# Patient Record
Sex: Female | Born: 1940
Health system: Southern US, Community
[De-identification: ages and names within clinical notes are randomized; demographics above are authoritative.]

## PROBLEM LIST (undated history)

## (undated) DIAGNOSIS — K922 Gastrointestinal hemorrhage, unspecified: Secondary | ICD-10-CM

## (undated) DIAGNOSIS — I509 Heart failure, unspecified: Secondary | ICD-10-CM

## (undated) DIAGNOSIS — J189 Pneumonia, unspecified organism: Secondary | ICD-10-CM

## (undated) DIAGNOSIS — E785 Hyperlipidemia, unspecified: Secondary | ICD-10-CM

## (undated) DIAGNOSIS — Z9981 Dependence on supplemental oxygen: Secondary | ICD-10-CM

## (undated) DIAGNOSIS — D649 Anemia, unspecified: Secondary | ICD-10-CM

## (undated) DIAGNOSIS — I4901 Ventricular fibrillation: Secondary | ICD-10-CM

## (undated) DIAGNOSIS — J302 Other seasonal allergic rhinitis: Secondary | ICD-10-CM

## (undated) DIAGNOSIS — I639 Cerebral infarction, unspecified: Secondary | ICD-10-CM

## (undated) DIAGNOSIS — I10 Essential (primary) hypertension: Secondary | ICD-10-CM

## (undated) DIAGNOSIS — M1990 Unspecified osteoarthritis, unspecified site: Secondary | ICD-10-CM

## (undated) DIAGNOSIS — J449 Chronic obstructive pulmonary disease, unspecified: Secondary | ICD-10-CM

## (undated) DIAGNOSIS — Z9289 Personal history of other medical treatment: Secondary | ICD-10-CM

## (undated) DIAGNOSIS — I469 Cardiac arrest, cause unspecified: Secondary | ICD-10-CM

## (undated) DIAGNOSIS — K219 Gastro-esophageal reflux disease without esophagitis: Secondary | ICD-10-CM

## (undated) DIAGNOSIS — J42 Unspecified chronic bronchitis: Secondary | ICD-10-CM

## (undated) DIAGNOSIS — C801 Malignant (primary) neoplasm, unspecified: Secondary | ICD-10-CM

## (undated) DIAGNOSIS — I480 Paroxysmal atrial fibrillation: Secondary | ICD-10-CM

## (undated) DIAGNOSIS — N183 Chronic kidney disease, stage 3 unspecified: Secondary | ICD-10-CM

## (undated) DIAGNOSIS — F419 Anxiety disorder, unspecified: Secondary | ICD-10-CM

## (undated) HISTORY — PX: SKIN CANCER EXCISION: SHX779

## (undated) HISTORY — PX: CARDIAC CATHETERIZATION: SHX172

---

## 2013-11-26 DIAGNOSIS — I639 Cerebral infarction, unspecified: Secondary | ICD-10-CM

## 2013-11-26 HISTORY — DX: Cerebral infarction, unspecified: I63.9

## 2016-07-09 DIAGNOSIS — I2510 Atherosclerotic heart disease of native coronary artery without angina pectoris: Secondary | ICD-10-CM | POA: Insufficient documentation

## 2016-07-09 DIAGNOSIS — R072 Precordial pain: Secondary | ICD-10-CM | POA: Insufficient documentation

## 2016-07-09 DIAGNOSIS — I447 Left bundle-branch block, unspecified: Secondary | ICD-10-CM | POA: Insufficient documentation

## 2017-11-26 DIAGNOSIS — I4891 Unspecified atrial fibrillation: Secondary | ICD-10-CM

## 2017-11-26 DIAGNOSIS — D62 Acute posthemorrhagic anemia: Secondary | ICD-10-CM | POA: Diagnosis not present

## 2017-11-26 DIAGNOSIS — S37009A Unspecified injury of unspecified kidney, initial encounter: Secondary | ICD-10-CM

## 2017-11-26 DIAGNOSIS — K922 Gastrointestinal hemorrhage, unspecified: Secondary | ICD-10-CM

## 2017-11-26 DIAGNOSIS — I679 Cerebrovascular disease, unspecified: Secondary | ICD-10-CM

## 2017-11-27 DIAGNOSIS — I509 Heart failure, unspecified: Secondary | ICD-10-CM

## 2017-11-27 DIAGNOSIS — K922 Gastrointestinal hemorrhage, unspecified: Secondary | ICD-10-CM | POA: Diagnosis not present

## 2017-11-27 DIAGNOSIS — I69922 Dysarthria following unspecified cerebrovascular disease: Secondary | ICD-10-CM

## 2017-11-27 DIAGNOSIS — S37009A Unspecified injury of unspecified kidney, initial encounter: Secondary | ICD-10-CM | POA: Diagnosis not present

## 2017-11-27 DIAGNOSIS — I679 Cerebrovascular disease, unspecified: Secondary | ICD-10-CM | POA: Diagnosis not present

## 2017-11-27 DIAGNOSIS — R410 Disorientation, unspecified: Secondary | ICD-10-CM

## 2017-11-27 DIAGNOSIS — D62 Acute posthemorrhagic anemia: Secondary | ICD-10-CM | POA: Diagnosis not present

## 2017-11-28 DIAGNOSIS — I679 Cerebrovascular disease, unspecified: Secondary | ICD-10-CM | POA: Diagnosis not present

## 2017-11-28 DIAGNOSIS — D62 Acute posthemorrhagic anemia: Secondary | ICD-10-CM | POA: Diagnosis not present

## 2017-11-28 DIAGNOSIS — S37009A Unspecified injury of unspecified kidney, initial encounter: Secondary | ICD-10-CM | POA: Diagnosis not present

## 2017-11-28 DIAGNOSIS — R410 Disorientation, unspecified: Secondary | ICD-10-CM | POA: Diagnosis not present

## 2017-11-28 DIAGNOSIS — K922 Gastrointestinal hemorrhage, unspecified: Secondary | ICD-10-CM | POA: Diagnosis not present

## 2017-11-29 DIAGNOSIS — K922 Gastrointestinal hemorrhage, unspecified: Secondary | ICD-10-CM | POA: Diagnosis not present

## 2017-11-29 DIAGNOSIS — D62 Acute posthemorrhagic anemia: Secondary | ICD-10-CM | POA: Diagnosis not present

## 2017-11-29 DIAGNOSIS — I679 Cerebrovascular disease, unspecified: Secondary | ICD-10-CM | POA: Diagnosis not present

## 2017-11-29 DIAGNOSIS — S37009A Unspecified injury of unspecified kidney, initial encounter: Secondary | ICD-10-CM | POA: Diagnosis not present

## 2017-11-30 DIAGNOSIS — I679 Cerebrovascular disease, unspecified: Secondary | ICD-10-CM | POA: Diagnosis not present

## 2017-11-30 DIAGNOSIS — D62 Acute posthemorrhagic anemia: Secondary | ICD-10-CM | POA: Diagnosis not present

## 2017-11-30 DIAGNOSIS — K922 Gastrointestinal hemorrhage, unspecified: Secondary | ICD-10-CM | POA: Diagnosis not present

## 2017-11-30 DIAGNOSIS — S37009A Unspecified injury of unspecified kidney, initial encounter: Secondary | ICD-10-CM | POA: Diagnosis not present

## 2017-12-01 DIAGNOSIS — S37009A Unspecified injury of unspecified kidney, initial encounter: Secondary | ICD-10-CM | POA: Diagnosis not present

## 2017-12-01 DIAGNOSIS — I679 Cerebrovascular disease, unspecified: Secondary | ICD-10-CM | POA: Diagnosis not present

## 2017-12-01 DIAGNOSIS — K922 Gastrointestinal hemorrhage, unspecified: Secondary | ICD-10-CM | POA: Diagnosis not present

## 2017-12-01 DIAGNOSIS — D62 Acute posthemorrhagic anemia: Secondary | ICD-10-CM | POA: Diagnosis not present

## 2017-12-02 DIAGNOSIS — S37009A Unspecified injury of unspecified kidney, initial encounter: Secondary | ICD-10-CM | POA: Diagnosis not present

## 2017-12-02 DIAGNOSIS — I679 Cerebrovascular disease, unspecified: Secondary | ICD-10-CM | POA: Diagnosis not present

## 2017-12-02 DIAGNOSIS — D62 Acute posthemorrhagic anemia: Secondary | ICD-10-CM | POA: Diagnosis not present

## 2017-12-02 DIAGNOSIS — R410 Disorientation, unspecified: Secondary | ICD-10-CM | POA: Diagnosis not present

## 2017-12-02 DIAGNOSIS — K922 Gastrointestinal hemorrhage, unspecified: Secondary | ICD-10-CM | POA: Diagnosis not present

## 2017-12-03 DIAGNOSIS — S37009A Unspecified injury of unspecified kidney, initial encounter: Secondary | ICD-10-CM | POA: Diagnosis not present

## 2017-12-03 DIAGNOSIS — I679 Cerebrovascular disease, unspecified: Secondary | ICD-10-CM | POA: Diagnosis not present

## 2017-12-03 DIAGNOSIS — K922 Gastrointestinal hemorrhage, unspecified: Secondary | ICD-10-CM | POA: Diagnosis not present

## 2017-12-03 DIAGNOSIS — D62 Acute posthemorrhagic anemia: Secondary | ICD-10-CM | POA: Diagnosis not present

## 2017-12-04 DIAGNOSIS — D62 Acute posthemorrhagic anemia: Secondary | ICD-10-CM | POA: Diagnosis not present

## 2017-12-04 DIAGNOSIS — I679 Cerebrovascular disease, unspecified: Secondary | ICD-10-CM | POA: Diagnosis not present

## 2017-12-04 DIAGNOSIS — K922 Gastrointestinal hemorrhage, unspecified: Secondary | ICD-10-CM | POA: Diagnosis not present

## 2017-12-04 DIAGNOSIS — S37009A Unspecified injury of unspecified kidney, initial encounter: Secondary | ICD-10-CM | POA: Diagnosis not present

## 2017-12-05 DIAGNOSIS — K922 Gastrointestinal hemorrhage, unspecified: Secondary | ICD-10-CM | POA: Diagnosis not present

## 2017-12-05 DIAGNOSIS — I679 Cerebrovascular disease, unspecified: Secondary | ICD-10-CM | POA: Diagnosis not present

## 2017-12-05 DIAGNOSIS — S37009A Unspecified injury of unspecified kidney, initial encounter: Secondary | ICD-10-CM | POA: Diagnosis not present

## 2017-12-05 DIAGNOSIS — D62 Acute posthemorrhagic anemia: Secondary | ICD-10-CM | POA: Diagnosis not present

## 2018-06-18 DIAGNOSIS — I739 Peripheral vascular disease, unspecified: Secondary | ICD-10-CM | POA: Insufficient documentation

## 2018-06-18 DIAGNOSIS — G8929 Other chronic pain: Secondary | ICD-10-CM | POA: Insufficient documentation

## 2018-06-18 DIAGNOSIS — S91209A Unspecified open wound of unspecified toe(s) with damage to nail, initial encounter: Secondary | ICD-10-CM | POA: Insufficient documentation

## 2018-06-18 DIAGNOSIS — M79672 Pain in left foot: Secondary | ICD-10-CM

## 2018-11-17 DIAGNOSIS — S37009A Unspecified injury of unspecified kidney, initial encounter: Secondary | ICD-10-CM

## 2018-11-17 DIAGNOSIS — I679 Cerebrovascular disease, unspecified: Secondary | ICD-10-CM

## 2018-11-17 DIAGNOSIS — I509 Heart failure, unspecified: Secondary | ICD-10-CM

## 2018-11-17 DIAGNOSIS — I10 Essential (primary) hypertension: Secondary | ICD-10-CM

## 2018-11-17 DIAGNOSIS — D72829 Elevated white blood cell count, unspecified: Secondary | ICD-10-CM

## 2018-11-17 DIAGNOSIS — A419 Sepsis, unspecified organism: Secondary | ICD-10-CM

## 2018-11-17 DIAGNOSIS — J189 Pneumonia, unspecified organism: Secondary | ICD-10-CM

## 2018-11-17 DIAGNOSIS — J449 Chronic obstructive pulmonary disease, unspecified: Secondary | ICD-10-CM

## 2018-11-18 DIAGNOSIS — A419 Sepsis, unspecified organism: Secondary | ICD-10-CM | POA: Diagnosis not present

## 2018-11-19 DIAGNOSIS — I4901 Ventricular fibrillation: Secondary | ICD-10-CM

## 2018-11-19 DIAGNOSIS — I480 Paroxysmal atrial fibrillation: Secondary | ICD-10-CM | POA: Diagnosis not present

## 2018-11-19 DIAGNOSIS — R7989 Other specified abnormal findings of blood chemistry: Secondary | ICD-10-CM

## 2018-11-19 DIAGNOSIS — E785 Hyperlipidemia, unspecified: Secondary | ICD-10-CM | POA: Diagnosis not present

## 2018-11-19 DIAGNOSIS — I509 Heart failure, unspecified: Secondary | ICD-10-CM | POA: Diagnosis not present

## 2018-11-19 DIAGNOSIS — A419 Sepsis, unspecified organism: Secondary | ICD-10-CM | POA: Diagnosis not present

## 2018-11-19 DIAGNOSIS — I472 Ventricular tachycardia: Secondary | ICD-10-CM | POA: Diagnosis not present

## 2018-11-19 DIAGNOSIS — I469 Cardiac arrest, cause unspecified: Secondary | ICD-10-CM

## 2018-11-19 HISTORY — DX: Ventricular fibrillation: I49.01

## 2018-11-19 HISTORY — DX: Cardiac arrest, cause unspecified: I46.9

## 2018-11-20 ENCOUNTER — Encounter (HOSPITAL_COMMUNITY): Payer: Self-pay | Admitting: Physician Assistant

## 2018-11-20 ENCOUNTER — Other Ambulatory Visit: Payer: Self-pay

## 2018-11-20 ENCOUNTER — Inpatient Hospital Stay (HOSPITAL_COMMUNITY)
Admission: AD | Admit: 2018-11-20 | Discharge: 2018-11-28 | DRG: 286 | Disposition: A | Discharge status: Home Health | Payer: Medicare Other | Source: Other Acute Inpatient Hospital | Attending: Internal Medicine | Admitting: Internal Medicine

## 2018-11-20 ENCOUNTER — Inpatient Hospital Stay (HOSPITAL_COMMUNITY): Payer: Medicare Other

## 2018-11-20 DIAGNOSIS — J9601 Acute respiratory failure with hypoxia: Secondary | ICD-10-CM | POA: Diagnosis present

## 2018-11-20 DIAGNOSIS — I472 Ventricular tachycardia: Secondary | ICD-10-CM | POA: Diagnosis not present

## 2018-11-20 DIAGNOSIS — E440 Moderate protein-calorie malnutrition: Secondary | ICD-10-CM | POA: Diagnosis present

## 2018-11-20 DIAGNOSIS — E873 Alkalosis: Secondary | ICD-10-CM | POA: Diagnosis not present

## 2018-11-20 DIAGNOSIS — I214 Non-ST elevation (NSTEMI) myocardial infarction: Secondary | ICD-10-CM | POA: Diagnosis not present

## 2018-11-20 DIAGNOSIS — I480 Paroxysmal atrial fibrillation: Secondary | ICD-10-CM

## 2018-11-20 DIAGNOSIS — R609 Edema, unspecified: Secondary | ICD-10-CM

## 2018-11-20 DIAGNOSIS — I313 Pericardial effusion (noninflammatory): Secondary | ICD-10-CM | POA: Diagnosis present

## 2018-11-20 DIAGNOSIS — E875 Hyperkalemia: Secondary | ICD-10-CM | POA: Diagnosis not present

## 2018-11-20 DIAGNOSIS — I081 Rheumatic disorders of both mitral and tricuspid valves: Secondary | ICD-10-CM | POA: Diagnosis not present

## 2018-11-20 DIAGNOSIS — R079 Chest pain, unspecified: Secondary | ICD-10-CM

## 2018-11-20 DIAGNOSIS — N183 Chronic kidney disease, stage 3 (moderate): Secondary | ICD-10-CM | POA: Diagnosis present

## 2018-11-20 DIAGNOSIS — F419 Anxiety disorder, unspecified: Secondary | ICD-10-CM | POA: Diagnosis present

## 2018-11-20 DIAGNOSIS — I248 Other forms of acute ischemic heart disease: Secondary | ICD-10-CM | POA: Diagnosis present

## 2018-11-20 DIAGNOSIS — I469 Cardiac arrest, cause unspecified: Secondary | ICD-10-CM

## 2018-11-20 DIAGNOSIS — I272 Pulmonary hypertension, unspecified: Secondary | ICD-10-CM | POA: Diagnosis present

## 2018-11-20 DIAGNOSIS — F0390 Unspecified dementia without behavioral disturbance: Secondary | ICD-10-CM | POA: Diagnosis present

## 2018-11-20 DIAGNOSIS — I10 Essential (primary) hypertension: Secondary | ICD-10-CM | POA: Diagnosis not present

## 2018-11-20 DIAGNOSIS — I340 Nonrheumatic mitral (valve) insufficiency: Secondary | ICD-10-CM | POA: Diagnosis not present

## 2018-11-20 DIAGNOSIS — E782 Mixed hyperlipidemia: Secondary | ICD-10-CM

## 2018-11-20 DIAGNOSIS — K219 Gastro-esophageal reflux disease without esophagitis: Secondary | ICD-10-CM | POA: Diagnosis not present

## 2018-11-20 DIAGNOSIS — Z8673 Personal history of transient ischemic attack (TIA), and cerebral infarction without residual deficits: Secondary | ICD-10-CM

## 2018-11-20 DIAGNOSIS — J449 Chronic obstructive pulmonary disease, unspecified: Secondary | ICD-10-CM

## 2018-11-20 DIAGNOSIS — Z880 Allergy status to penicillin: Secondary | ICD-10-CM

## 2018-11-20 DIAGNOSIS — I361 Nonrheumatic tricuspid (valve) insufficiency: Secondary | ICD-10-CM | POA: Diagnosis not present

## 2018-11-20 DIAGNOSIS — F329 Major depressive disorder, single episode, unspecified: Secondary | ICD-10-CM | POA: Diagnosis not present

## 2018-11-20 DIAGNOSIS — E871 Hypo-osmolality and hyponatremia: Secondary | ICD-10-CM

## 2018-11-20 DIAGNOSIS — A419 Sepsis, unspecified organism: Secondary | ICD-10-CM | POA: Diagnosis not present

## 2018-11-20 DIAGNOSIS — I447 Left bundle-branch block, unspecified: Secondary | ICD-10-CM | POA: Diagnosis present

## 2018-11-20 DIAGNOSIS — N179 Acute kidney failure, unspecified: Secondary | ICD-10-CM | POA: Diagnosis not present

## 2018-11-20 DIAGNOSIS — J440 Chronic obstructive pulmonary disease with acute lower respiratory infection: Secondary | ICD-10-CM | POA: Diagnosis present

## 2018-11-20 DIAGNOSIS — J189 Pneumonia, unspecified organism: Secondary | ICD-10-CM

## 2018-11-20 DIAGNOSIS — Z8249 Family history of ischemic heart disease and other diseases of the circulatory system: Secondary | ICD-10-CM

## 2018-11-20 DIAGNOSIS — R0603 Acute respiratory distress: Secondary | ICD-10-CM | POA: Diagnosis not present

## 2018-11-20 DIAGNOSIS — R05 Cough: Secondary | ICD-10-CM

## 2018-11-20 DIAGNOSIS — E785 Hyperlipidemia, unspecified: Secondary | ICD-10-CM | POA: Diagnosis not present

## 2018-11-20 DIAGNOSIS — J13 Pneumonia due to Streptococcus pneumoniae: Secondary | ICD-10-CM

## 2018-11-20 DIAGNOSIS — I4891 Unspecified atrial fibrillation: Secondary | ICD-10-CM

## 2018-11-20 DIAGNOSIS — R7989 Other specified abnormal findings of blood chemistry: Secondary | ICD-10-CM | POA: Diagnosis not present

## 2018-11-20 DIAGNOSIS — I471 Supraventricular tachycardia: Secondary | ICD-10-CM | POA: Diagnosis not present

## 2018-11-20 DIAGNOSIS — I5033 Acute on chronic diastolic (congestive) heart failure: Secondary | ICD-10-CM | POA: Diagnosis present

## 2018-11-20 DIAGNOSIS — E876 Hypokalemia: Secondary | ICD-10-CM | POA: Diagnosis present

## 2018-11-20 DIAGNOSIS — I4901 Ventricular fibrillation: Secondary | ICD-10-CM | POA: Diagnosis not present

## 2018-11-20 DIAGNOSIS — I2510 Atherosclerotic heart disease of native coronary artery without angina pectoris: Secondary | ICD-10-CM | POA: Diagnosis present

## 2018-11-20 DIAGNOSIS — I130 Hypertensive heart and chronic kidney disease with heart failure and stage 1 through stage 4 chronic kidney disease, or unspecified chronic kidney disease: Secondary | ICD-10-CM | POA: Diagnosis present

## 2018-11-20 DIAGNOSIS — M1990 Unspecified osteoarthritis, unspecified site: Secondary | ICD-10-CM | POA: Diagnosis present

## 2018-11-20 DIAGNOSIS — I4721 Torsades de pointes: Secondary | ICD-10-CM

## 2018-11-20 DIAGNOSIS — R6521 Severe sepsis with septic shock: Secondary | ICD-10-CM | POA: Diagnosis not present

## 2018-11-20 DIAGNOSIS — I5031 Acute diastolic (congestive) heart failure: Secondary | ICD-10-CM | POA: Diagnosis not present

## 2018-11-20 DIAGNOSIS — I4819 Other persistent atrial fibrillation: Secondary | ICD-10-CM | POA: Diagnosis not present

## 2018-11-20 DIAGNOSIS — Z87891 Personal history of nicotine dependence: Secondary | ICD-10-CM

## 2018-11-20 DIAGNOSIS — S37009A Unspecified injury of unspecified kidney, initial encounter: Secondary | ICD-10-CM | POA: Diagnosis not present

## 2018-11-20 DIAGNOSIS — Z833 Family history of diabetes mellitus: Secondary | ICD-10-CM

## 2018-11-20 DIAGNOSIS — R059 Cough, unspecified: Secondary | ICD-10-CM

## 2018-11-20 DIAGNOSIS — E7800 Pure hypercholesterolemia, unspecified: Secondary | ICD-10-CM | POA: Diagnosis not present

## 2018-11-20 DIAGNOSIS — M954 Acquired deformity of chest and rib: Secondary | ICD-10-CM | POA: Diagnosis present

## 2018-11-20 HISTORY — DX: Chronic kidney disease, stage 3 unspecified: N18.30

## 2018-11-20 HISTORY — DX: Anemia, unspecified: D64.9

## 2018-11-20 HISTORY — DX: Ventricular fibrillation: I49.01

## 2018-11-20 HISTORY — DX: Heart failure, unspecified: I50.9

## 2018-11-20 HISTORY — DX: Hyperlipidemia, unspecified: E78.5

## 2018-11-20 HISTORY — DX: Chronic kidney disease, stage 3 (moderate): N18.3

## 2018-11-20 HISTORY — DX: Cerebral infarction, unspecified: I63.9

## 2018-11-20 HISTORY — DX: Paroxysmal atrial fibrillation: I48.0

## 2018-11-20 HISTORY — DX: Gastro-esophageal reflux disease without esophagitis: K21.9

## 2018-11-20 HISTORY — DX: Essential (primary) hypertension: I10

## 2018-11-20 HISTORY — DX: Cardiac arrest, cause unspecified: I46.9

## 2018-11-20 HISTORY — DX: Personal history of other medical treatment: Z92.89

## 2018-11-20 HISTORY — DX: Malignant (primary) neoplasm, unspecified: C80.1

## 2018-11-20 HISTORY — DX: Unspecified osteoarthritis, unspecified site: M19.90

## 2018-11-20 HISTORY — DX: Anxiety disorder, unspecified: F41.9

## 2018-11-20 HISTORY — DX: Dependence on supplemental oxygen: Z99.81

## 2018-11-20 HISTORY — DX: Pneumonia, unspecified organism: J18.9

## 2018-11-20 HISTORY — DX: Gastrointestinal hemorrhage, unspecified: K92.2

## 2018-11-20 HISTORY — DX: Other seasonal allergic rhinitis: J30.2

## 2018-11-20 HISTORY — DX: Chronic obstructive pulmonary disease, unspecified: J44.9

## 2018-11-20 HISTORY — DX: Unspecified chronic bronchitis: J42

## 2018-11-20 LAB — TROPONIN I: Troponin I: 0.29 ng/mL (ref <0.03)

## 2018-11-20 MED ORDER — SODIUM CHLORIDE 0.9 % IV SOLN
250.0000 mL | INTRAVENOUS | Status: DC | PRN
  Administered 2018-11-23: 250 mL via INTRAVENOUS

## 2018-11-20 MED ORDER — SODIUM CHLORIDE 0.9 % IV SOLN
1.0000 g | INTRAVENOUS | Status: DC
  Administered 2018-11-21 – 2018-11-24 (×4): 1 g via INTRAVENOUS
  Filled 2018-11-20 (×4): qty 1

## 2018-11-20 MED ORDER — AMIODARONE HCL IN DEXTROSE 360-4.14 MG/200ML-% IV SOLN
30.0000 mg/h | INTRAVENOUS | Status: DC
  Administered 2018-11-21 – 2018-11-25 (×9): 30 mg/h via INTRAVENOUS
  Filled 2018-11-20 (×10): qty 200

## 2018-11-20 MED ORDER — IBUPROFEN 200 MG PO TABS
400.0000 mg | ORAL_TABLET | Freq: Four times a day (QID) | ORAL | Status: DC | PRN
  Administered 2018-11-20 – 2018-11-27 (×7): 400 mg via ORAL
  Filled 2018-11-20 (×8): qty 2

## 2018-11-20 MED ORDER — SODIUM CHLORIDE 0.9% FLUSH: 3.0000 mL | INTRAVENOUS | Status: DC | PRN

## 2018-11-20 MED ORDER — ENSURE ENLIVE PO LIQD
237.0000 mL | Freq: Two times a day (BID) | ORAL | Status: DC
  Administered 2018-11-21 – 2018-11-28 (×13): 237 mL via ORAL

## 2018-11-20 MED ORDER — HEPARIN (PORCINE) 25000 UT/250ML-% IV SOLN
1100.0000 [IU]/h | INTRAVENOUS | Status: DC
  Administered 2018-11-20: 700 [IU]/h via INTRAVENOUS
  Filled 2018-11-20: qty 250

## 2018-11-20 MED ORDER — ACETAMINOPHEN 650 MG RE SUPP: 650.0000 mg | Freq: Four times a day (QID) | RECTAL | Status: DC | PRN

## 2018-11-20 MED ORDER — ENOXAPARIN SODIUM 40 MG/0.4ML ~~LOC~~ SOLN: 40.0000 mg | SUBCUTANEOUS | Status: DC

## 2018-11-20 MED ORDER — SODIUM CHLORIDE 0.9% FLUSH
3.0000 mL | Freq: Two times a day (BID) | INTRAVENOUS | Status: DC
  Administered 2018-11-21 – 2018-11-24 (×3): 3 mL via INTRAVENOUS

## 2018-11-20 MED ORDER — ACETAMINOPHEN 325 MG PO TABS
650.0000 mg | ORAL_TABLET | Freq: Four times a day (QID) | ORAL | Status: DC | PRN
  Administered 2018-11-21 – 2018-11-27 (×11): 650 mg via ORAL
  Filled 2018-11-20 (×12): qty 2

## 2018-11-20 NOTE — Progress Notes (Addendum)
ANTICOAGULATION & ANTIBIOTIC CONSULT NOTE - Initial Consult  Pharmacy Consult for heparin; Cefepime Indication: atrial fibrillation; HCAP   Allergies not on file  Patient Measurements:   Heparin Dosing Weight: 48kg   Vital Signs:    Labs: No results for input(s): HGB, HCT, PLT, APTT, LABPROT, INR, HEPARINUNFRC, HEPRLOWMOCWT, CREATININE, CKTOTAL, CKMB, TROPONINI in the last 72 hours.  CrCl cannot be calculated (No successful lab value found.).   Medical History: No past medical history on file.  Medications:  No medications prior to admission.    Assessment: 28 yof who is a direct transfer [employer] center. She presented to OSH with Septic shock and Afib and had a code-blue event requiring intubation. Was extubated earlier today and transferred to [facility] for cardiac cath.   AC: Of note she is currently on IV heparin at 700 units after a bolus of 2000 units at ~ 1500 today.   ID: She was also on Cefepime 1 gm IV Q 24 hours for sepsis. Her last dose was at ~1130 this AM.   Of note patient is also on amiodarone at 30 mg/hr which will be continued for now. Will allow cardiology to decide when to transition to oral dosing.   Goal of Therapy:  Heparin level 0.3-0.7 units/ml Monitor platelets by anticoagulation protocol: Yes   Plan:  -Continue IV heparin at 700 units/hr  -F/u 8 hr HL  -Monitor daily HL, CBC, and s/s of bleeding  -Continue cefepime 1 gm IV Q 24 hours and adjust based on renal fx  Albertina Parr, PharmD., BCPS Clinical Pharmacist Clinical phone for 11/20/18 until 10:30pm: [phone] If after 10:30pm, please refer to [facility] for unit-specific pharmacist

## 2018-11-20 NOTE — H&P (Signed)
History and Physical    Joy Dominguez  POE:[id number]  DOB: 08/14/1941  DOA: 11/20/2018 PCP: Patient, No Pcp Per   Transfer from [place] [facility] for cardiac cath.   HPI: Joy Dominguez is a 77 y.o. female with medical history of CVA, dementia, HTN, HLD, A-fib, CHF, COPD, CKD 3, GERD admitted to [facility] on 11/17/18 for acute respiratory failure due to CAP, septic shock requiring pressors. Also had A-fib with RVR which resolved. Noted to have minimally elevated troponin and cardiology suspected demand ischemia.  On 11/19/18 developed Torsades and a code blue was called. She was defibrillated, received compressions and 2 gm of Mg+ with ROSC. (she had a normal Mg+ level that morning). She was intubated during the code and extubated today. Cardiology recommended a cardiac cath for which she was transferred to [facility].    Review of Systems:  Has a cough with yellow sputum but this is getting better Has lower rib pain on and off   Past medical history: VA, dementia, HTN, HLD, A-fib, CHF, COPD, CKD 3, GERD a    Social History:  No longer smokes. Drinks alcohol rarely  Allergies: PCN  Family history: HTN   Prior to Admission medications   Not on File    Physical Exam: Resp Rate   18 17   Resp Rate   Pulse   84 82   Pulse   ECG Heart Rate   88 83   ECG Heart Rate   Rhythm   NSR    Rhythm   BP (cuff)   145...    BP (cuff)   MAP (cuff)   77    MAP (cuff)   GCS   15    GCS        Wt Readings from Last 3 Encounters:  No data found for Wt   Today's Vitals   11/20/18 1655  PainSc: 8    There is no height or weight on file to calculate BMI.Creta Levin    Constitutional:  Calm & comfortable Eyes: PERRLA, lids and conjunctivae normal ENT:  Mucous membranes are moist.  Pharynx clear of exudate   Normal dentition.  Neck: Supple, no masses  Respiratory:  Mild rhonchi- pulse ox 97% on room air Normal respiratory effort.    Cardiovascular:  S1 & S2 heard, regular rate and rhythm No Murmurs Abdomen:  Non distended No tenderness, No masses Bowel sounds normal Extremities:  No clubbing / cyanosis No pedal edema No joint deformity    Skin:  No rashes, lesions or ulcers Neurologic:  AAO x 3 CN 2-12 grossly intact Sensation intact Strength 5/5 in all 4 extremities Psychiatric:  Normal Mood and affect    Labs on Admission: I have personally reviewed following labs and imaging studies  CBC: No results for input(s): WBC, NEUTROABS, HGB, HCT, MCV, PLT in the last 168 hours. Basic Metabolic Panel: No results for input(s): NA, K, CL, CO2, GLUCOSE, BUN, CREATININE, CALCIUM, MG, PHOS in the last 168 hours. GFR: CrCl cannot be calculated (No successful lab value found.). Liver Function Tests: No results for input(s): AST, ALT, ALKPHOS, BILITOT, PROT, ALBUMIN in the last 168 hours. No results for input(s): LIPASE, AMYLASE in the last 168 hours. No results for input(s): AMMONIA in the last 168 hours. Coagulation Profile: No results for input(s): INR, PROTIME in the last 168 hours. Cardiac Enzymes: No results for input(s): CKTOTAL, CKMB, CKMBINDEX, TROPONINI in the last 168 hours. BNP (last 3 results)  No results for input(s): PROBNP in the last 8760 hours. HbA1C: No results for input(s): HGBA1C in the last 72 hours. CBG: No results for input(s): GLUCAP in the last 168 hours. Lipid Profile: No results for input(s): CHOL, HDL, LDLCALC, TRIG, CHOLHDL, LDLDIRECT in the last 72 hours. Thyroid Function Tests: No results for input(s): TSH, T4TOTAL, FREET4, T3FREE, THYROIDAB in the last 72 hours. Anemia Panel: No results for input(s): VITAMINB12, FOLATE, FERRITIN, TIBC, IRON, RETICCTPCT in the last 72 hours. Urine analysis: No results found for: COLORURINE, APPEARANCEUR, LABSPEC, PHURINE, GLUCOSEU, HGBUR, BILIRUBINUR, KETONESUR, PROTEINUR, UROBILINOGEN, NITRITE, LEUKOCYTESUR Sepsis  Labs: @LABRCNTIP (procalcitonin:4,lacticidven:4) )No results found for this or any previous visit (from the past 240 hour(s)).   Radiological Exams on Admission: No results found.   Telemetry monitor: sinus rhythm  Assessment/Plan Principal Problem:   Cardiac arrest with ventricular fibrillation-  Torsades de pointes  - cont Amiodarone infusion - ECHO at [facility] has poor images- showed normal EF and impaired diastolic relaxation - transferred here for Cath-  cardiology to manage  Abnormal movement of chest wall on exam - check rib films to r/o fracture  Pneumonia -Septic shock H/o  COPD  - no COPD exacerbation - WBC count 18.3 on admission improved to 11.2 - on Cefepime since 11/17/18 with a recommended stop date of 11/24/18 by the attending physician - continue    COPD (chronic obstructive pulmonary disease)  - stable     AKI (acute kidney injury) - Cr 1.9 on admission improved to 1.10 - hold Lasix    Atrial fibrillation   - currently NSR - on Heparin infusion    Benign essential HTN - resume Coreg     HLD - cont statin   I am awaiting the med rec to be completed and I will then order home medication.      DVT prophylaxis: Heparin infusion Code Status: Full code  Family Communication:   Disposition Plan: admit to telemetry  Consults called: cardiology     Debbe Odea MD [facility] Pager: [email or web address] Password [email or web address] 7PM-7AM, please contact night-coverage   11/20/2018, 5:47 PM

## 2018-11-20 NOTE — Consult Note (Addendum)
Cardiology Consultation:   Patient ID: Joy Dominguez; [id number]; [birth year]   Admit date: 11/20/2018 Date of Consult: 11/20/2018  Primary Care Provider: Patient, No Pcp Per Primary Cardiologist: No primary care provider on file. Primary Electrophysiologist:  None   Patient Profile:   Joy Dominguez is a 77 y.o. female with a hx of CKD III, COPD, HTN, CVA, HLD, PAF, GERD, who is being seen today for the evaluation of cardiac issues at the request of Dr Wynelle Cleveland.  History of Present Illness:   Ms. Swint was admitted to [facility] on 12/23 for shortness of breath, sepsis, CAP, elevated troponin, acute respiratory failure 2nd COPD, CHF, & PNA; AKI on CKD.  QT noted to be prolonged at 481 ms, avoid QT prolonging drugs. She got Lasix 80 mg IV x 1 for CHF. Initially on BiPAP and was hypothermic, requiring Bair hugger>>temp improved.  Digoxin given for elevated heart rate, heart rate improved.  Per EMS reports, there was no heat or electricity in the house and patient was sitting in her own feces.  She initially required Levophed for pressure support but this was weaned off.  Echo on 12/23 w/ suboptimal views, no LV measurements obtained.  EF 65-70%, impaired relaxation noted.  Left atrium mildly dilated and right atrium mildly enlarged.  Aortic valve not well visualized but mean gradient 8 mm and peak gradient 19 mmHg, MR noted RV pressure 35 mmHg.  Blood gases showed hypoxia and low bicarb.  By 12/26, her blood gas had improved to 7.3 4/39/104/21/20 2/-4.4.  BUN was 20 with a creatinine of 1.5 on admission, by discharge, BUN 17 with creatinine of 1.0.  Potassium was 3.3.  She was improving, but had a cardiac arrest 12/25 2nd VT versus V. fib, possible torsades.  She required CPR, got Mg 2 gm, epi x1, and defib x 1, ROSC.  She was intubated because she was not able to protect her airway, but was weaned off vent this morning and extubated.  Her magnesium level was  checked 12/25 and was 1.9.  She was started on amiodarone drip and heparin drip.  Troponins were checked and were initially elevated.  However, after the arrest, they peaked at 0.69.  She was seen by Dr Bonney Leitz (cardiologist w/ [facility]) 12/25 at [facility], and it was felt she needed transferred to [facility] for cath.  Mrs. Simkin is oriented to name only.  She denies recent chest pain.  She states she had chest pain prior to her work cardiac catheterization which was done sometime during [facility].  She does not remember the results, but does not think they put a stent in.  She said the doctor talked to her husband who has since died.  She says she has had some palpitations, but cannot remember when or describe them further.  She denies chest pain.  She denies lower extremity edema.  She feels she is breathing pretty well.  She does have some chest soreness.  She has no other ongoing complaints.   Past Medical History:  Diagnosis Date  . CHF (congestive heart failure) ([place])   . CKD (chronic kidney disease), stage III ([place])   . COPD (chronic obstructive pulmonary disease) ([place])   . CVA (cerebral vascular accident) ([place]) 2015  . GERD (gastroesophageal reflux disease)   . HTN (hypertension)   . Hyperlipidemia   . OA (osteoarthritis)   . PAF (paroxysmal atrial fibrillation) ([place])     Past Surgical History:  Procedure Laterality Date  .  CARDIAC CATHETERIZATION     > 10 yr ago, no PCI     Prior to Admission medications   See paper chart, D/C summary    Inpatient Medications: Scheduled Meds: . sodium chloride flush  3 mL Intravenous Q12H   Continuous Infusions: . sodium chloride    . [START ON 11/21/2018] amiodarone    . [START ON 11/21/2018] ceFEPime (MAXIPIME) IV    . heparin     PRN Meds: sodium chloride, acetaminophen **OR** acetaminophen, ibuprofen, sodium chloride flush  Allergies:   Allergies not on file  Social History:   Social History   Socioeconomic History    . Marital status: Widowed    Spouse name: Not on file  . Number of children: Not on file  . Years of education: Not on file  . Highest education level: Not on file  Occupational History  . Occupation: Retired  Scientific laboratory technician  . Financial resource strain: Not on file  . Food insecurity:    Worry: Not on file    Inability: Not on file  . Transportation needs:    Medical: Not on file    Non-medical: Not on file  Tobacco Use  . Smoking status: Not on file  Substance and Sexual Activity  . Alcohol use: Not on file  . Drug use: Not on file  . Sexual activity: Not on file  Lifestyle  . Physical activity:    Days per week: Not on file    Minutes per session: Not on file  . Stress: Not on file  Relationships  . Social connections:    Talks on phone: Not on file    Gets together: Not on file    Attends religious service: Not on file    Active member of club or organization: Not on file    Attends meetings of clubs or organizations: Not on file    Relationship status: Not on file  . Intimate partner violence:    Fear of current or ex partner: Not on file    Emotionally abused: Not on file    Physically abused: Not on file    Forced sexual activity: Not on file  Other Topics Concern  . Not on file  Social History Narrative   Pt lives in [place], she says w/ 2 sons, says U[place] Rising helps w/ her medicines.    Family History:   Family History  Problem Relation Age of Onset  . Hypertension Mother   . Heart disease Father   . Diabetes Brother   . Cancer - Colon Brother    Family Status:  Family Status  Relation Name Status  . Mother  Deceased  . Father  Deceased  . Brother  Deceased    ROS:  Please see the history of present illness.  All other ROS reviewed and negative.     Physical Exam/Data:  There were no vitals filed for this visit. No intake or output data in the 24 hours ending 11/20/18 1916 There were no vitals filed for this visit. There is no height or weight  on file to calculate BMI.  General:  Well developed, frail, elderly female, in no acute distress HEENT: normal Lymph: no adenopathy Neck: JVD 9-10 cm Endocrine:  No thryomegaly Vascular: No carotid bruits; 4/4 extremity pulses 2+, without bruits  Cardiac:  normal S1, S2; RRR; no murmur  Lungs: Some rales and rhonchi bilaterally, no wheezing  Abd: soft, nontender, no hepatomegaly  Ext: no edema Musculoskeletal:  No deformities, BUE  and BLE strength normal and equal Skin: warm and dry  Neuro:  CNs 2-12 intact, no focal abnormalities noted Psych:  Normal affect   EKG:  The EKG was personally reviewed and demonstrates: 12/23, sinus rhythm with sinus arrhythmia, left bundle branch block of unknown duration Telemetry:  Telemetry was personally reviewed and demonstrates: Sinus rhythm here  Relevant CV Studies:  ECHO: 12/23 suboptimal views, no LV measurements obtained.  EF 65-70%, impaired relaxation noted.  Left atrium mildly dilated and right atrium mildly enlarged.  Aortic valve not well visualized but mean gradient 8 mm and peak gradient 19 mmHg, MR noted RV pressure 35 mmHg.  Laboratory Data: See paper chart  Radiology/Studies:  No results found.  Assessment and Plan:   1. Cardiac arrest, elevated troponin:  -Unclear cause, magnesium was 1.9 that morning, potassium was not extremely low, no chest pain, troponin elevated, but not extreme. -No wall motion on echocardiogram and EF is normal -Repeat echocardiogram, continue to cycle troponins and see if they are trending up or trending down -Continue heparin for now, history of PAF and patient was supposed to be on Eliquis prior to admission - Discuss situation with her sons, they may prefer conservative management as she had no known ischemic symptoms prior to her cardiac arrest  2.  Diastolic CHF: Patient received 1 dose of IV Lasix on arrival.  No other Lasix given since then -Recheck chest x-ray and BNP - Then decide on whether  or diuresis is needed  3.  PAF and additional arrhythmia: Continue anticoagulation with heparin or Eliquis depending on plans for further procedures. - She will benefit from a beta-blocker, need to make sure her blood pressure will tolerate -She was on Coreg prior to admission, but this was held because of sepsis and hypotension  Otherwise, per IM Principal Problem:   Cardiac arrest with ventricular fibrillation ([place]) Active Problems:   CAP (community acquired pneumonia)   Torsades de pointes ([place])   COPD (chronic obstructive pulmonary disease) ([place])   Septic shock ([place])   Demand ischemia (HCC)   AKI (acute kidney injury) ([place])   Atrial fibrillation ([place])   Benign essential HTN   HLD (hyperlipidemia)   GERD (gastroesophageal reflux disease)   Hyponatremia  For questions or updates, please contact  [facility] Please consult [email or web address] for contact info under Cardiology/STEMI.   Signed, Rosaria Ferries, PA-C  11/20/2018 7:16 PM   The patient was seen, examined and discussed with Rosaria Ferries, PA-C and I agree with the above.    77 y.o. female with a hx of CKD III, COPD, HTN, CVA, HLD, PAF, GERD, who was admitted to [facility] on 12/23 for shortness of breath, sepsis, CAP.  It is not clear in what conditions the patient lives, the patient is a poor historian with underlying dementia and family is currently not present or reachable by phone.  However patient states that her house does not have lighter heating and she was found in her feces in her house before taken to the hospital. She was found to have community-acquired pneumonia and treated with antibiotics.  Levophed was weaned off. On admission she required pressors -Levophed for septic shock, use of BiPAP, she also had mild troponin elevation of 0.28 that was attributed to demand ischemia, echocardiogram at that time showed hyperdynamic LVEF with impaired relaxation and no significant valvular abnormality. Yesterday,  December 25 she developed torsades requiring defibrillation after which her troponin increased to 0.53.  She was started on heparin drip and was  also hydrated as she was originally found with acute kidney failure secondary to dehydration.  Her hemoglobin of 13 on admission dropped to 9.4.  She was started on amiodarone drip and magnesium of 1.9 was repleted.  The patient denies any chest pain prior to admission during the hospitalization or now also did not have any shortness of breath prior to admission.  She states that she can walk with assistance.  Assessment  Community-acquired pneumonia complicated by acute septic shock requiring short-term of Levophed Troponin elevation in the settings of septic shock and torsades requiring ICD shock Torsades the pointes Advanced dementia History of CVA Left bundle branch block, no prior EKG available for comparison Acute kidney failure in the setting of dehydration Paroxysmal atrial fibrillation  Plan: Obtain labs, specifically CBC CMP and troponin x2 Discontinue heparin if troponins are down trending Repeat an echocardiogram as prior was performed prior to torsades and ICD shocks Considering the patient has advanced dementia, lack of symptoms and prior history of stroke, unless significant change in LVEF or new regional wall motion abnormalities I would strongly recommend to avoid invasive cardiac catheterization and risk stratify the patient with a Lexiscan nuclear stress test. Continue amiodarone drip for now Replace potassium, magnesium as needed based on labs results  Ena Dawley, MD 11/20/2018

## 2018-11-21 ENCOUNTER — Encounter (HOSPITAL_COMMUNITY): Payer: Self-pay | Admitting: General Practice

## 2018-11-21 ENCOUNTER — Inpatient Hospital Stay (HOSPITAL_COMMUNITY): Payer: Medicare Other

## 2018-11-21 ENCOUNTER — Inpatient Hospital Stay: Payer: Self-pay

## 2018-11-21 ENCOUNTER — Ambulatory Visit (HOSPITAL_COMMUNITY): Payer: Medicare Other

## 2018-11-21 DIAGNOSIS — E440 Moderate protein-calorie malnutrition: Secondary | ICD-10-CM

## 2018-11-21 DIAGNOSIS — I361 Nonrheumatic tricuspid (valve) insufficiency: Secondary | ICD-10-CM

## 2018-11-21 DIAGNOSIS — I214 Non-ST elevation (NSTEMI) myocardial infarction: Secondary | ICD-10-CM

## 2018-11-21 DIAGNOSIS — J189 Pneumonia, unspecified organism: Secondary | ICD-10-CM

## 2018-11-21 DIAGNOSIS — I340 Nonrheumatic mitral (valve) insufficiency: Secondary | ICD-10-CM

## 2018-11-21 LAB — CBC
HEMATOCRIT: 31.6 % — AB (ref 36.0–46.0)
Hemoglobin: 10 g/dL — ABNORMAL LOW (ref 12.0–15.0)
MCH: 28.9 pg (ref 26.0–34.0)
MCHC: 31.6 g/dL (ref 30.0–36.0)
MCV: 91.3 fL (ref 80.0–100.0)
Platelets: 284 10*3/uL (ref 150–400)
RBC: 3.46 MIL/uL — ABNORMAL LOW (ref 3.87–5.11)
RDW: 12.4 % (ref 11.5–15.5)
WBC: 13.7 10*3/uL — ABNORMAL HIGH (ref 4.0–10.5)
nRBC: 0 % (ref 0.0–0.2)

## 2018-11-21 LAB — HEPARIN LEVEL (UNFRACTIONATED)
Heparin Unfractionated: 0.13 IU/mL — ABNORMAL LOW (ref 0.30–0.70)
Heparin Unfractionated: 0.33 IU/mL (ref 0.30–0.70)
Heparin Unfractionated: <0.1 IU/mL — ABNORMAL LOW (ref 0.30–0.70)

## 2018-11-21 LAB — BASIC METABOLIC PANEL
Anion gap: 11 (ref 5–15)
BUN: 13 mg/dL (ref 8–23)
CHLORIDE: 109 mmol/L (ref 98–111)
CO2: 17 mmol/L — ABNORMAL LOW (ref 22–32)
Calcium: 8.7 mg/dL — ABNORMAL LOW (ref 8.9–10.3)
Creatinine, Ser: 1.03 mg/dL — ABNORMAL HIGH (ref 0.44–1.00)
GFR calc Af Amer: 60 mL/min — ABNORMAL LOW (ref >60)
GFR calc non Af Amer: 52 mL/min — ABNORMAL LOW (ref >60)
GLUCOSE: 91 mg/dL (ref 70–99)
Potassium: 4.5 mmol/L (ref 3.5–5.1)
Sodium: 137 mmol/L (ref 135–145)

## 2018-11-21 LAB — TROPONIN I
Troponin I: 0.26 ng/mL (ref <0.03)
Troponin I: 0.16 ng/mL (ref <0.03)

## 2018-11-21 LAB — MAGNESIUM: Magnesium: 1.9 mg/dL (ref 1.7–2.4)

## 2018-11-21 LAB — ECHOCARDIOGRAM COMPLETE
Height: 62 in
WEIGHTICAEL: 1696 [oz_av]

## 2018-11-21 MED ORDER — CITALOPRAM HYDROBROMIDE 20 MG PO TABS: 20.0000 mg | ORAL_TABLET | Freq: Every day | ORAL | Status: DC

## 2018-11-21 MED ORDER — CARVEDILOL 3.125 MG PO TABS
3.1250 mg | ORAL_TABLET | Freq: Two times a day (BID) | ORAL | Status: DC
  Administered 2018-11-21: 3.125 mg via ORAL
  Filled 2018-11-21: qty 1

## 2018-11-21 MED ORDER — ALBUTEROL SULFATE (2.5 MG/3ML) 0.083% IN NEBU: 2.5000 mg | INHALATION_SOLUTION | Freq: Four times a day (QID) | RESPIRATORY_TRACT | Status: DC | PRN

## 2018-11-21 MED ORDER — FUROSEMIDE 10 MG/ML IJ SOLN
40.0000 mg | Freq: Two times a day (BID) | INTRAMUSCULAR | Status: DC
  Administered 2018-11-21: 40 mg via INTRAVENOUS
  Filled 2018-11-21: qty 4

## 2018-11-21 MED ORDER — MAGNESIUM SULFATE 2 GM/50ML IV SOLN
2.0000 g | Freq: Once | INTRAVENOUS | Status: AC
  Administered 2018-11-21: 2 g via INTRAVENOUS
  Filled 2018-11-21: qty 50

## 2018-11-21 MED ORDER — ATORVASTATIN CALCIUM 10 MG PO TABS
10.0000 mg | ORAL_TABLET | Freq: Every day | ORAL | Status: DC
  Administered 2018-11-21 – 2018-11-22 (×2): 10 mg via ORAL
  Filled 2018-11-21 (×2): qty 1

## 2018-11-21 MED ORDER — AMLODIPINE BESYLATE 5 MG PO TABS
5.0000 mg | ORAL_TABLET | Freq: Every day | ORAL | Status: DC
  Administered 2018-11-21: 5 mg via ORAL
  Filled 2018-11-21: qty 1

## 2018-11-21 MED ORDER — FUROSEMIDE 10 MG/ML IJ SOLN
40.0000 mg | Freq: Three times a day (TID) | INTRAMUSCULAR | Status: DC
  Administered 2018-11-21 – 2018-11-22 (×2): 40 mg via INTRAVENOUS
  Filled 2018-11-21 (×2): qty 4

## 2018-11-21 MED ORDER — SODIUM CHLORIDE 0.9% FLUSH
10.0000 mL | Freq: Two times a day (BID) | INTRAVENOUS | Status: DC
  Administered 2018-11-21 – 2018-11-25 (×7): 10 mL
  Administered 2018-11-26: 20 mL
  Administered 2018-11-26: 10 mL
  Administered 2018-11-27: 20 mL
  Administered 2018-11-27: 10 mL

## 2018-11-21 MED ORDER — AMLODIPINE BESYLATE 10 MG PO TABS
10.0000 mg | ORAL_TABLET | Freq: Every day | ORAL | Status: DC
  Administered 2018-11-22 – 2018-11-28 (×7): 10 mg via ORAL
  Filled 2018-11-21 (×7): qty 1

## 2018-11-21 MED ORDER — ADULT MULTIVITAMIN W/MINERALS CH
1.0000 | ORAL_TABLET | Freq: Every day | ORAL | Status: DC
  Administered 2018-11-21 – 2018-11-28 (×8): 1 via ORAL
  Filled 2018-11-21 (×8): qty 1

## 2018-11-21 MED ORDER — LAMOTRIGINE 25 MG PO TABS
25.0000 mg | ORAL_TABLET | Freq: Three times a day (TID) | ORAL | Status: DC
  Administered 2018-11-21 – 2018-11-28 (×21): 25 mg via ORAL
  Filled 2018-11-21 (×24): qty 1

## 2018-11-21 MED ORDER — SODIUM CHLORIDE 0.9% FLUSH
10.0000 mL | INTRAVENOUS | Status: DC | PRN
  Administered 2018-11-25: 10 mL
  Filled 2018-11-21: qty 40

## 2018-11-21 MED ORDER — GUAIFENESIN ER 600 MG PO TB12
600.0000 mg | ORAL_TABLET | Freq: Two times a day (BID) | ORAL | Status: DC
  Administered 2018-11-21 – 2018-11-28 (×15): 600 mg via ORAL
  Filled 2018-11-21 (×16): qty 1

## 2018-11-21 MED ORDER — GABAPENTIN 100 MG PO CAPS
100.0000 mg | ORAL_CAPSULE | Freq: Every day | ORAL | Status: DC
  Administered 2018-11-21 – 2018-11-27 (×7): 100 mg via ORAL
  Filled 2018-11-21 (×7): qty 1

## 2018-11-21 MED ORDER — HEPARIN BOLUS VIA INFUSION
1000.0000 [IU] | Freq: Once | INTRAVENOUS | Status: AC
  Administered 2018-11-21: 1000 [IU] via INTRAVENOUS
  Filled 2018-11-21: qty 1000

## 2018-11-21 MED ORDER — HYDRALAZINE HCL 25 MG PO TABS
25.0000 mg | ORAL_TABLET | Freq: Four times a day (QID) | ORAL | Status: DC
  Administered 2018-11-21 – 2018-11-22 (×4): 25 mg via ORAL
  Filled 2018-11-21 (×4): qty 1

## 2018-11-21 MED ORDER — LOSARTAN POTASSIUM 25 MG PO TABS
25.0000 mg | ORAL_TABLET | Freq: Every day | ORAL | Status: DC
  Administered 2018-11-22: 25 mg via ORAL
  Filled 2018-11-21: qty 1

## 2018-11-21 NOTE — Progress Notes (Signed)
*  PRELIMINARY RESULTS* Echocardiogram 2D Echocardiogram has been performed.  Samuel Germany 11/21/2018, 3:19 PM

## 2018-11-21 NOTE — Progress Notes (Addendum)
PROGRESS NOTE    Joy Dominguez   ZOX:[id number]  DOB: August 26, 1941  DOA: 11/20/2018 PCP: Welford Roche, NP   Brief Narrative:  Joy Dominguez  is a 77 y.o. female with medical history of CVA, dementia, HTN, HLD, A-fib, CHF, COPD, CKD 3, GERD admitted to [facility] center on 11/17/18 for acute respiratory failure due to CAP, septic shock requiring pressors. Also had A-fib with RVR which resolved. Noted to have minimally elevated troponin and cardiology suspected demand ischemia.  On 11/19/18 developed Torsades and a code blue was called. She was defibrillated, received compressions and 2 gm of Mg+ with ROSC. (she had a normal Mg+ and K+ level that morning). She was intubated during the code and extubated today. Cardiology recommended a cardiac cath for which she was transferred to [facility].    Subjective: Has a cough today.    Assessment & Plan:   Principal Problem:   Cardiac arrest with ventricular fibrillation-  Torsades de pointes  minimally elevated troponin - cont Amiodarone infusion - ECHO at [facility] has poor images- showed normal EF and impaired diastolic relaxation (done prior to cardiac arrest)- repeating ECHO - transferred here for Cath-  cardiology to manage-   Acute CHF, unspecifiec - crackles on exam today, CXR suggestive of pulm edem- have started Lasix IV - repeat ECHO pending- d/w Dr Meda Coffee    Benign essential HTN - resume Norvasc and increase dose as BP is quite high- add Hydralazine today as well and diurese  Pneumonia -Septic shock H/o  COPD  - has some wheezing and congestion today- ?COPD exacerbation - cont Albuterol Nebs - WBC count 18.3 on admission (in [place]) improved to 11.2 - on Cefepime since 11/17/18 with a recommended stop date of 11/24/18 by the attending physician - continue Albuterol nebs  Abnormal movement of chest wall on exam - checked rib films which show deformities due to prior fractures     AKI (acute  kidney injury) - Cr 1.9 on admission to [facility]- improved to ~ 1.0    Atrial fibrillation  / SVT  - cont Coreg, Amiodarone - on Heparin infusion    HLD - cont statin    DVT prophylaxis: Heparin infusion Code Status: Full code Family Communication:  Disposition Plan: follow on telemetry Consultants:   cardiology Procedures:   ECHO  CPR/ Intubation Antimicrobials:  Anti-infectives (From admission, onward)   Start     Dose/Rate Route Frequency Ordered Stop   11/21/18 1100  ceFEPIme (MAXIPIME) 1 g in sodium chloride 0.9 % 100 mL IVPB     1 g 200 mL/hr over 30 Minutes Intravenous Every 24 hours 11/20/18 1838         Objective: Vitals:   11/21/18 0818 11/21/18 0826 11/21/18 1100 11/21/18 1119  BP: (!) 165/81   (!) 177/80  Pulse:  83    Resp: (!) 28 (!) 26    Temp:      TempSrc:      SpO2:  100%    Weight:   48.1 kg   Height:   5\' 2"  (1.575 m)     Intake/Output Summary (Last 24 hours) at 11/21/2018 1454 Last data filed at 11/21/2018 0844 Gross per 24 hour  Intake 135.33 ml  Output -  Net 135.33 ml   Filed Weights   11/21/18 1100  Weight: 48.1 kg    Examination: General exam: Appears comfortable  HEENT: PERRLA, oral mucosa moist, no sclera icterus or thrush Respiratory system: b/l crackles-  Cardiovascular system: S1 & S2 heard, RRR.   Gastrointestinal system: Abdomen soft, non-tender, nondistended. Normal bowel sounds. Central nervous system: Alert and oriented. No focal neurological deficits. Extremities: No cyanosis, clubbing or edema Skin: No rashes or ulcers Psychiatry:  Mood & affect appropriate.     Data Reviewed: I have personally reviewed following labs and imaging studies  CBC: Recent Labs  Lab 11/21/18 0036  WBC 13.7*  HGB 10.0*  HCT 31.6*  MCV 91.3  PLT 710   Basic Metabolic Panel: Recent Labs  Lab 11/21/18 0036  NA 137  K 4.5  CL 109  CO2 17*  GLUCOSE 91  BUN 13  CREATININE 1.03*  CALCIUM 8.7*  MG 1.9    GFR: Estimated Creatinine Clearance: 33.6 mL/min (A) (by C-G formula based on SCr of 1.03 mg/dL (H)). Liver Function Tests: No results for input(s): AST, ALT, ALKPHOS, BILITOT, PROT, ALBUMIN in the last 168 hours. No results for input(s): LIPASE, AMYLASE in the last 168 hours. No results for input(s): AMMONIA in the last 168 hours. Coagulation Profile: No results for input(s): INR, PROTIME in the last 168 hours. Cardiac Enzymes: Recent Labs  Lab 11/20/18 2024 11/21/18 0036 11/21/18 0659  TROPONINI 0.29* 0.26* 0.16*   BNP (last 3 results) No results for input(s): PROBNP in the last 8760 hours. HbA1C: No results for input(s): HGBA1C in the last 72 hours. CBG: No results for input(s): GLUCAP in the last 168 hours. Lipid Profile: No results for input(s): CHOL, HDL, LDLCALC, TRIG, CHOLHDL, LDLDIRECT in the last 72 hours. Thyroid Function Tests: No results for input(s): TSH, T4TOTAL, FREET4, T3FREE, THYROIDAB in the last 72 hours. Anemia Panel: No results for input(s): VITAMINB12, FOLATE, FERRITIN, TIBC, IRON, RETICCTPCT in the last 72 hours. Urine analysis: No results found for: COLORURINE, APPEARANCEUR, LABSPEC, PHURINE, GLUCOSEU, HGBUR, BILIRUBINUR, KETONESUR, PROTEINUR, UROBILINOGEN, NITRITE, LEUKOCYTESUR Sepsis Labs: @LABRCNTIP (procalcitonin:4,lacticidven:4) )No results found for this or any previous visit (from the past 240 hour(s)).       Radiology Studies: Dg Chest 2 View  Result Date: 11/20/2018 CLINICAL DATA:  Status post CPR EXAM: CHEST - 2 VIEW COMPARISON:  None. FINDINGS: Cardiac shadow is at the upper limits of normal in size. Aortic calcifications are seen. Mild central vascular congestion is noted as well as prominent pleural effusions left greater than right. No definitive acute bony abnormality is seen. Previously seen rib deformities are again noted and likely chronic. IMPRESSION: Vascular congestion and bilateral pleural effusions left greater than right.  Electronically Signed   By: Inez Catalina M.D.   On: 11/20/2018 20:45   Dg Ribs Bilateral  Result Date: 11/20/2018 CLINICAL DATA:  Status post CPR, chest pain EXAM: BILATERAL RIBS - 3+ VIEW COMPARISON:  None. FINDINGS: Cardiac shadow is enlarged. Rib deformities are noted bilaterally involving the third sixth seventh and eighth ribs on the left and the fifth rib on the right. These are likely related to prior fractures. No acute displaced fracture is noted. No pneumothorax is seen. Bilateral pleural effusions left greater than right are noted with central vascular congestion. IMPRESSION: Multiple rib deformities likely chronic in nature. Vascular congestion with bilateral effusions left greater than right. No pneumothorax is seen. Electronically Signed   By: Inez Catalina M.D.   On: 11/20/2018 20:37      Scheduled Meds: . amLODipine  5 mg Oral Daily  . atorvastatin  10 mg Oral Daily  . carvedilol  3.125 mg Oral BID WC  . feeding supplement (ENSURE ENLIVE)  237 mL Oral BID  BM  . furosemide  40 mg Intravenous BID  . gabapentin  100 mg Oral QHS  . guaiFENesin  600 mg Oral BID  . lamoTRIgine  25 mg Oral TID  . multivitamin with minerals  1 tablet Oral Daily  . sodium chloride flush  3 mL Intravenous Q12H   Continuous Infusions: . sodium chloride    . amiodarone 30 mg/hr (11/21/18 1404)  . ceFEPime (MAXIPIME) IV 1 g (11/21/18 1140)  . heparin 900 Units/hr (11/21/18 0227)  . magnesium sulfate 1 - 4 g bolus IVPB       LOS: 1 day    Time spent in minutes: [place], MD [facility] Pager: [email or web address] Password [facility] 11/21/2018, 2:54 PM

## 2018-11-21 NOTE — Progress Notes (Addendum)
Progress Note  Patient Name: Jorge Amparo Date of Encounter: 11/21/2018  Primary Cardiologist: Ena Dawley, MD   Subjective   Out of bed and in a chair. Currently CP free but she reports recent CP, left sided and sharp. Currently on [place]. Also with productive cough and wheezing.   Inpatient Medications    Scheduled Meds: . [START ON 11/22/2018] amLODipine  10 mg Oral Daily  . atorvastatin  10 mg Oral Daily  . feeding supplement (ENSURE ENLIVE)  237 mL Oral BID BM  . furosemide  40 mg Intravenous TID  . gabapentin  100 mg Oral QHS  . guaiFENesin  600 mg Oral BID  . hydrALAZINE  25 mg Oral Q6H  . lamoTRIgine  25 mg Oral TID  . multivitamin with minerals  1 tablet Oral Daily  . sodium chloride flush  10-40 mL Intracatheter Q12H  . sodium chloride flush  3 mL Intravenous Q12H   Continuous Infusions: . sodium chloride    . amiodarone 30 mg/hr (11/21/18 1948)  . ceFEPime (MAXIPIME) IV 1 g (11/21/18 1140)  . heparin 900 Units/hr (11/21/18 1748)   PRN Meds: sodium chloride, acetaminophen **OR** acetaminophen, albuterol, ibuprofen, sodium chloride flush, sodium chloride flush   Vital Signs    Vitals:   11/21/18 1100 11/21/18 1119 11/21/18 1811 11/21/18 2001  BP:  (!) 177/80 (!) 149/71 (!) 146/57  Pulse:    81  Resp:    18  Temp:    97.9 F (36.6 C)  TempSrc:    Oral  SpO2:    100%  Weight: 48.1 kg     Height: 5\' 2"  (1.575 m)       Intake/Output Summary (Last 24 hours) at 11/21/2018 2251 Last data filed at 11/21/2018 1948 Gross per 24 hour  Intake 642.85 ml  Output 1200 ml  Net -557.15 ml   Filed Weights   11/21/18 1100  Weight: 48.1 kg    Telemetry    NSR, brief run of SVT earlier this morning - Personally Reviewed  ECG    NSR, LBBB- Personally Reviewed  Physical Exam   GEN: Elderly frail appearing WF in No acute distress but on [place]. Neck: No JVD Cardiac: RRR, no murmurs, rubs, or gallops.  Respiratory: diffuse bilateral expiratory  wheezing and rhonchi, crackles at the bases GI: Soft, nontender, non-distended  MS: No edema; No deformity. Neuro:  Nonfocal  Psych: Normal affect   Labs    Chemistry Recent Labs  Lab 11/21/18 0036  NA 137  K 4.5  CL 109  CO2 17*  GLUCOSE 91  BUN 13  CREATININE 1.03*  CALCIUM 8.7*  GFRNONAA 52*  GFRAA 60*  ANIONGAP 11     Hematology Recent Labs  Lab 11/21/18 0036  WBC 13.7*  RBC 3.46*  HGB 10.0*  HCT 31.6*  MCV 91.3  MCH 28.9  MCHC 31.6  RDW 12.4  PLT 284    Cardiac Enzymes Recent Labs  Lab 11/20/18 2024 11/21/18 0036 11/21/18 0659  TROPONINI 0.29* 0.26* 0.16*   No results for input(s): TROPIPOC in the last 168 hours.   BNPNo results for input(s): BNP, PROBNP in the last 168 hours.   DDimer No results for input(s): DDIMER in the last 168 hours.   Radiology    Dg Chest 2 View  Result Date: 11/20/2018 CLINICAL DATA:  Status post CPR EXAM: CHEST - 2 VIEW COMPARISON:  None. FINDINGS: Cardiac shadow is at the upper limits of normal in size. Aortic calcifications  are seen. Mild central vascular congestion is noted as well as prominent pleural effusions left greater than right. No definitive acute bony abnormality is seen. Previously seen rib deformities are again noted and likely chronic. IMPRESSION: Vascular congestion and bilateral pleural effusions left greater than right. Electronically Signed   By: Inez Catalina M.D.   On: 11/20/2018 20:45   Dg Ribs Bilateral  Result Date: 11/20/2018 CLINICAL DATA:  Status post CPR, chest pain EXAM: BILATERAL RIBS - 3+ VIEW COMPARISON:  None. FINDINGS: Cardiac shadow is enlarged. Rib deformities are noted bilaterally involving the third sixth seventh and eighth ribs on the left and the fifth rib on the right. These are likely related to prior fractures. No acute displaced fracture is noted. No pneumothorax is seen. Bilateral pleural effusions left greater than right are noted with central vascular congestion. IMPRESSION:  Multiple rib deformities likely chronic in nature. Vascular congestion with bilateral effusions left greater than right. No pneumothorax is seen. Electronically Signed   By: Inez Catalina M.D.   On: 11/20/2018 20:37   Dg Chest Port 1 View  Result Date: 11/21/2018 CLINICAL DATA:  Respiratory distress. EXAM: PORTABLE CHEST 1 VIEW COMPARISON:  11/20/2018 FINDINGS: A left PICC has been placed and terminates over the mid to lower SVC. The cardiac silhouette is borderline enlarged. Aortic atherosclerosis is again noted. There is persistent pulmonary vascular congestion with patchy perihilar and bibasilar lung opacities, left slightly worse than right and similar to the prior study. Small pleural effusions, left larger than right, are also unchanged. No pneumothorax is identified. Bilateral rib fractures were more fully evaluated on yesterday's dedicated rib radiographs. IMPRESSION: 1. Left PICC terminates over the mid to lower SVC. 2. Unchanged pulmonary small pleural effusions, vascular congestion, and bilateral lung opacities which may reflect edema versus pneumonia. Electronically Signed   By: Logan Bores M.D.   On: 11/21/2018 17:56   [place] Ekg Site Rite  Result Date: 11/21/2018 If Site Rite image not attached, placement could not be confirmed due to current cardiac rhythm.   Cardiac Studies   ECHO: 11/17/18 [place]) suboptimal views, no LV measurements obtained.  EF 65-70%, impaired relaxation noted.  Left atrium mildly dilated and right atrium mildly enlarged.  Aortic valve not well visualized but mean gradient 8 mm and peak gradient 19 mmHg, MR noted RV pressure 35 mmHg.  F/u Post Arrest Echo 11/21/2018.  Left ventricle: The cavity size was normal. Systolic function was   vigorous. The estimated ejection fraction was in the range of 65%   to 70%. The study is not technically sufficient to allow   evaluation of LV diastolic function. Doppler parameters are   consistent with high ventricular  filling pressure. Ratio of   mitral valve peak E velocity to medial annulus E (e&') velocity:   40.21. - Aortic valve: Mildly thickened leaflets. Mobility was not   restricted. Sclerosis without stenosis. Transvalvular velocity   was within the normal range. There was no stenosis. There was no   regurgitation. - Mitral valve: Calcified annulus. There was mild regurgitation.   Mean gradient (D): 5 mm Hg (HR 100 bpm). - Left atrium: The atrium was moderately dilated. - Right ventricle: The cavity size was mildly dilated. Wall   thickness was normal. Systolic function was normal. RV systolic   pressure (S, est): 46 mm Hg. - Right atrium: The atrium was mildly dilated. Central venous   pressure (est): 3 mm Hg. - Tricuspid valve: There was moderate regurgitation. - Pulmonary arteries: Systolic  pressure was moderately increased. - Inferior vena cava: The vessel was normal in size. The   respirophasic diameter changes were in the normal range (>= 50%),   consistent with normal central venous pressure. - Pericardium, extracardiac: A small pericardial effusion was   identified anterior to the heart. The fluid exhibited a fibrinous   appearance. There was a left pleural effusion.    Patient Profile     Ms. Lippold is a 77 y/o female who was initially admitted to [facility] on 12/23 for shortness of breath, sepsis, CAP, elevated troponin, acute respiratory failure 2/2 COPD, CHF, & PNA and also had AKI on CKD. She was noted to have a prolonged QT and ultimately had cardiac arrest,  VT versus V. fib, possible torsades. She required CPR, got Mg 2 gm, epi x1, and defib x 1 w/ ROSC. Was intubated. Troponin post arrest was 0.69 and she was transferred to [facility] for further management.   Assessment & Plan    1. Cardiac Arrest/ NSTEMI: per records obtained from OSH she was noted to have a prolonged QT initially on admit EKGs and later developed cardiac arrest, VT versus V. Fib/ possible torsades. She  required CPR, got Mg 2 gm, epi x1, and defib x 1 w/ ROSC. Was intubated. Troponin post arrest was 0.69. Transferred to [facility]. Troponins here are trending downward, 0.26>>0.26>>0.16. 2D echo is pending.  She reports recent CP but atypical, described as sharp pain. She is being treated for PNA. She has multiple risk factors for CAD including known PVD, HTN and HLD. This should be ruled out. Given her COPD and wheezing on exam, I don't think she would be a good candidate for chemical stress test w/ Lexiscan. Better to do cath.She did eat breakfast earlier today.  Continue on IV heparin for now. Cath on Monday or possibly later this afternoon, depending on cath schedule. Add ASA. Continue statin, but will increase Lipitor to 80 mg. Check FLP in the am.   2. ? VT: now on amiodarone. She had a brief run of SVT on tele earlier today but no VT since transfer to [facility]. Continue to monitor closely. Keep Mg >2.0 and K > 4.0. Plan likely cardiac cath either later this afternoon or on Monday to assess for obstructive CAD. 2D echo pending.   3. Prolonged QT: avoid QT prolonging agents.   4. PNA: management per IM.   5. HTN: elevated. Continue amlodipine, Coreg and Lasix. May need to add an ARB. Monitor.   For questions or updates, please contact [place] Please consult [email or web address] for contact info under     Signed, Ena Dawley, MD  11/21/2018, 10:51 PM    The patient was seen, examined and discussed with Brittainy M. Rosita Fire, PA-C and I agree with the above.   Assessment:  Community-acquired pneumonia complicated by acute septic shock requiring short-term of Levophed Troponin elevation in the settings of septic shock and torsades requiring ICD shock 0.29->0.16 Torsades the pointes Acute diastolic CHF Advanced dementia History of CVA Left bundle branch block, no prior EKG available for comparison Acute kidney failure in the setting of dehydration Paroxysmal atrial fibrillation Acue COPD  exacerbation  Plan:  Discontinue heparin as troponins are down trending Repeat an echocardiogram as prior was performed prior to torsades and ICD shocks Considering the patient has some dementia, and prior history of stroke, we were considering rather a stress test than cardiac cath, however she is wheezing today. We will optimize over the weekend and plan  for a cath on Monday. Echo results showed hyperdynamic LVEF 65-70% Hold BB as active wheezing Agree with lasix 40 mg IV TID Start losartan 25 mg po daily Crea has improved. K has been replaced. Treatment for COPD per primary team. Discontinue heparin drip. Obtain magnesium and replace if needed.  Follow ECGs for prolonged QT.   Ena Dawley, MD 11/21/2018

## 2018-11-21 NOTE — Progress Notes (Signed)
PT Cancellation Note  Patient Details Name: Joy Dominguez MRN: [id number] DOB: [birth year]   Cancelled Treatment:    Reason Eval/Treat Not Completed: Medical issues which prohibited therapy. Per RN, pt with irregular HR and hypoxia; rapid response to assess. Will follow-up for PT evaluation.  Mabeline Caras, PT, DPT Acute Rehabilitation Services  Pager [phone] Office [place] 11/21/2018, 2:25 PM

## 2018-11-21 NOTE — Progress Notes (Signed)
Initial Nutrition Assessment  DOCUMENTATION CODES:   Non-severe (moderate) malnutrition in context of chronic illness  INTERVENTION:   - Continue Ensure Enlive BID as ordered (each provides 350 kcal and 20 g protein) - Add MVI given poor PO intake  NUTRITION DIAGNOSIS:   Moderate Malnutrition related to chronic illness as evidenced by energy intake < 75% for > or equal to 3 months, mild fat depletion, mild muscle depletion.  GOAL:   Patient will meet greater than or equal to 90% of their needs  MONITOR:   PO intake, Supplement acceptance, Weight trends, Labs  REASON FOR ASSESSMENT:   Malnutrition Screening Tool    ASSESSMENT:   77 yo female, admitted for cardiac arrest with ventricular fibrillation. PMH significant for CVA, dementia, HTN, HLD, CHF, COPD, CKD 3, GERD. Admitted to [facility] on 12/23 for acute respiratory failure d/t CAP, septic shock requiring pressors. Transferred to [facility] on 12/25 for cardiac cath.  Labs: Creatinine 1.03, GFR 52%/60%, troponin I 0.16 and trending down, Hgb 10.0, Hct 31.6% Meds: Ensure Enlive BID  Pt sitting in chair at time of visit.  Reports poor appetite since at least the summer, worsening since then. Typically eats 3 meals daily provided by [employer], but does not finish them. Used to take 1-a-day MVI. PCP recommended she take vitamin C and vitamin D.  UBW 95-105#. Denies any recent wt loss.  Denies nausea or vomiting, or difficulty chewing or swallowing. Endorses some constipation - last BM was >2-3 days ago.   Encouraged pt to include protein-rich foods with all meals and snacks, and to eat those foods first if not feeling hungry. Will continue with Ensure Enlive BID as ordered.  NUTRITION - FOCUSED PHYSICAL EXAM:   Most Recent Value  Orbital Region  Mild depletion  Thoracic and Lumbar Region  Mild depletion  Buccal Region  Mild depletion  Temple Region  Mild depletion  Clavicle Bone Region  Mild depletion   Clavicle and Acromion Bone Region  Mild depletion  Scapular Bone Region  Mild depletion  Dorsal Hand  Moderate depletion  Patellar Region  Mild depletion  Anterior Thigh Region  Mild depletion  Posterior Calf Region  Mild depletion  Edema (RD Assessment)  None  Hair  Reviewed  Eyes  Reviewed  Mouth  Reviewed  Skin  Reviewed  Nails  Reviewed     Diet Order:  No meal intake recorded, per nsg documentation Diet Order            Diet regular Room service appropriate? Yes; Fluid consistency: Thin  Diet effective now              EDUCATION NEEDS:  No education needs have been identified at this time  Skin:  Skin Assessment: Skin Integrity Issues: Skin Integrity Issues:: Other (Comment) Other: Ecchymosis BL hand, arm, hip  Last BM:  PTA  Height:  Ht Readings from Last 1 Encounters:  11/21/18 5\' 2"  (1.575 m)    Weight:  Wt Readings from Last 1 Encounters:  11/21/18 48.1 kg    Ideal Body Weight:  50 kg  BMI:  Body mass index is 19.39 kg/m.  Estimated Nutritional Needs:   Kcal:  1373 calories daily (MSJ x 1.5)  Protein:  60-75 gm daily (1.2-1.5 g/kg IBW)  Fluid:  >/= 1.3 L daily or per MD discretion  Althea Grimmer, MS, RDN, LDN Pager: [phone]

## 2018-11-21 NOTE — Progress Notes (Signed)
ANTICOAGULATION + ANTIBIOTIC CONSULT NOTE - Follow Up Consult  Pharmacy Consult for Heparin;  Cefepime Indication: atrial fibrillation;  HCAP  Allergies not on file  Patient Measurements: Height: 5\' 2"  (157.5 cm) Weight: 106 lb (48.1 kg)(at [facility]) IBW/kg (Calculated) : 50.1 Heparin Dosing Weight: 48 kg  Vital Signs: Temp: 97.9 F (36.6 C) (12/27 2001) Temp Source: Oral (12/27 2001) BP: 146/57 (12/27 2001) Pulse Rate: 81 (12/27 2001)  Labs: Recent Labs    11/20/18 2024 11/21/18 0036 11/21/18 0659 11/21/18 1006 11/21/18 1812  HGB  --  10.0*  --   --   --   HCT  --  31.6*  --   --   --   PLT  --  284  --   --   --   HEPARINUNFRC  --  0.13*  --  0.33 <0.10*  CREATININE  --  1.03*  --   --   --   TROPONINI 0.29* 0.26* 0.16*  --   --     Estimated Creatinine Clearance: 33.6 mL/min (A) (by C-G formula based on SCr of 1.03 mg/dL (H)).  Assessment:  18 yof who is a direct transfer [employer] center. She presented to OSH with septic shock and Afib and had a code-blue event requiring intubation. Was extubated on 12/26 and transferred to [facility] for cardiac cath.  Heparin IV begun while at [facility]  Heparin level is undetectable, in speaking with RN discovered heparin was turned off until ~ 6 pm for PICC line placement.  Goal of Therapy:  Heparin level 0.3-0.7 units/ml Monitor platelets by anticoagulation protocol: Yes   Plan:  Check heparin level ~ 0200.  Marguerite Olea, [facility] Clinical Pharmacist Phone [phone]  11/21/2018 8:37 PM

## 2018-11-21 NOTE — Progress Notes (Signed)
ANTICOAGULATION + ANTIBIOTIC CONSULT NOTE - Follow Up Consult  Pharmacy Consult for Heparin;  Cefepime Indication: atrial fibrillation;  HCAP  Allergies not on file  Patient Measurements: Height: 5\' 2"  (157.5 cm) Weight: 106 lb (48.1 kg)(at [facility]) IBW/kg (Calculated) : 50.1 Heparin Dosing Weight: 48 kg  Vital Signs: Temp: 98 F (36.7 C) (12/27 0726) Temp Source: Oral (12/27 0726) BP: 177/80 (12/27 1119) Pulse Rate: 83 (12/27 0826)  Labs: Recent Labs    11/20/18 2024 11/21/18 0036 11/21/18 0659 11/21/18 1006  HGB  --  10.0*  --   --   HCT  --  31.6*  --   --   PLT  --  284  --   --   HEPARINUNFRC  --  0.13*  --  0.33  CREATININE  --  1.03*  --   --   TROPONINI 0.29* 0.26* 0.16*  --     Estimated Creatinine Clearance: 33.6 mL/min (A) (by C-G formula based on SCr of 1.03 mg/dL (H)).  Assessment:  45 yof who is a direct transfer [employer] center. She presented to OSH with septic shock and Afib and had a code-blue event requiring intubation. Was extubated on 12/26 and transferred to [facility] for cardiac cath.  Heparin IV begun while at [facility].    Heparin level is now low therapeutic (0.33) on 900 units/hr.    Day # 5 antibiotics, currently on Cefepime.  Goal of Therapy:  Heparin level 0.3-0.7 units/ml Monitor platelets by anticoagulation protocol: Yes   Plan:   Continue heparin drip at 900 units/hr.  Recheck heparin level ~6pm.  Daily heparin level and CBC while on heparin.  Noted plan for cardiac cath on Monday, 12/30.  Continue Cefepime 1gm IV q24hrs.  Follow renal function, clinical progress.  Arty Baumgartner, [place] Pager: [phone] or phone: [phone] 11/21/2018,11:31 AM

## 2018-11-21 NOTE — Progress Notes (Signed)
ANTICOAGULATION CONSULT NOTE - Follow Up Consult  Pharmacy Consult for heparin Indication: atrial fibrillation  Labs: Recent Labs    11/20/18 2024 11/21/18 0036  HGB  --  10.0*  HCT  --  31.6*  PLT  --  284  HEPARINUNFRC  --  0.13*  TROPONINI 0.29*  --     Assessment: 77yo female subtherapeutic on heparin with initial dosing for Afib; no gtt issues or signs of bleeding per RN.  Goal of Therapy:  Heparin level 0.3-0.7 units/ml   Plan:  Will rebolus with heparin 1000 units and increase heparin gtt by 4 units/kg/hr to 900 units/hr and check level in 8 hours.    Wynona Neat, PharmD, BCPS  11/21/2018,2:22 AM

## 2018-11-21 NOTE — Progress Notes (Signed)
Pt c/o sharp mid chest pain 7/10 associated with cough. EKG obtained and placed in chart. Upon completion of EKG, pt verbalized resolution of pain. Will continue to monitor patient condition closely.

## 2018-11-21 NOTE — Progress Notes (Signed)
Pt's external catheter was not working this morning.  Had a large amount of incontinent urine output.   Idolina Primer, RN

## 2018-11-21 NOTE — Progress Notes (Signed)
Peripherally Inserted Central Catheter/Midline Placement  The IV Nurse has discussed with the patient and/or persons authorized to consent for the patient, the purpose of this procedure and the potential benefits and risks involved with this procedure.  The benefits include less needle sticks, lab draws from the catheter, and the patient may be discharged home with the catheter. Risks include, but not limited to, infection, bleeding, blood clot (thrombus formation), and puncture of an artery; nerve damage and irregular heartbeat and possibility to perform a PICC exchange if needed/ordered by physician.  Alternatives to this procedure were also discussed.  Bard Power PICC patient education guide, fact sheet on infection prevention and patient information card has been provided to patient /or left at bedside.    PICC/Midline Placement Documentation        Joy Dominguez 11/21/2018, 4:50 PM

## 2018-11-21 NOTE — Plan of Care (Signed)
  Problem: Education: Goal: Knowledge of General Education information will improve Description Including pain rating scale, medication(s)/side effects and non-pharmacologic comfort measures Outcome: Progressing   Problem: Clinical Measurements: Goal: Respiratory complications will improve Outcome: Progressing   Problem: Activity: Goal: Ability to return to baseline activity level will improve Outcome: Progressing

## 2018-11-21 NOTE — Progress Notes (Signed)
Pt had 14 beat run of SVT in 150s.  C/O difficulty breathing. O2 sat 100% on 2L. LS diminished with some wheezes.   Cardiology made aware.  Idolina Primer, RN

## 2018-11-22 DIAGNOSIS — I272 Pulmonary hypertension, unspecified: Secondary | ICD-10-CM

## 2018-11-22 DIAGNOSIS — J449 Chronic obstructive pulmonary disease, unspecified: Secondary | ICD-10-CM

## 2018-11-22 DIAGNOSIS — K219 Gastro-esophageal reflux disease without esophagitis: Secondary | ICD-10-CM

## 2018-11-22 DIAGNOSIS — R079 Chest pain, unspecified: Secondary | ICD-10-CM

## 2018-11-22 DIAGNOSIS — E7800 Pure hypercholesterolemia, unspecified: Secondary | ICD-10-CM

## 2018-11-22 LAB — BASIC METABOLIC PANEL
Anion gap: 11 (ref 5–15)
Anion gap: 8 (ref 5–15)
BUN: 10 mg/dL (ref 8–23)
BUN: 13 mg/dL (ref 8–23)
CALCIUM: 8.1 mg/dL — AB (ref 8.9–10.3)
CHLORIDE: 103 mmol/L (ref 98–111)
CO2: 26 mmol/L (ref 22–32)
CO2: 29 mmol/L (ref 22–32)
Calcium: 8.8 mg/dL — ABNORMAL LOW (ref 8.9–10.3)
Chloride: 102 mmol/L (ref 98–111)
Creatinine, Ser: 0.85 mg/dL (ref 0.44–1.00)
Creatinine, Ser: 0.83 mg/dL (ref 0.44–1.00)
GFR calc Af Amer: >60 mL/min (ref >60)
GFR calc Af Amer: >60 mL/min (ref >60)
GFR calc non Af Amer: >60 mL/min (ref >60)
GFR calc non Af Amer: >60 mL/min (ref >60)
Glucose, Bld: 147 mg/dL — ABNORMAL HIGH (ref 70–99)
Glucose, Bld: 131 mg/dL — ABNORMAL HIGH (ref 70–99)
Potassium: 3.1 mmol/L — ABNORMAL LOW (ref 3.5–5.1)
Potassium: 3.5 mmol/L (ref 3.5–5.1)
Sodium: 139 mmol/L (ref 135–145)
Sodium: 140 mmol/L (ref 135–145)

## 2018-11-22 LAB — CBC
HCT: 31.3 % — ABNORMAL LOW (ref 36.0–46.0)
Hemoglobin: 10.2 g/dL — ABNORMAL LOW (ref 12.0–15.0)
MCH: 29.4 pg (ref 26.0–34.0)
MCHC: 32.6 g/dL (ref 30.0–36.0)
MCV: 90.2 fL (ref 80.0–100.0)
Platelets: 361 10*3/uL (ref 150–400)
RBC: 3.47 MIL/uL — ABNORMAL LOW (ref 3.87–5.11)
RDW: 12.4 % (ref 11.5–15.5)
WBC: 13.1 10*3/uL — ABNORMAL HIGH (ref 4.0–10.5)
nRBC: 0 % (ref 0.0–0.2)

## 2018-11-22 LAB — HEPARIN LEVEL (UNFRACTIONATED)
Heparin Unfractionated: 0.23 IU/mL — ABNORMAL LOW (ref 0.30–0.70)
Heparin Unfractionated: 0.25 IU/mL — ABNORMAL LOW (ref 0.30–0.70)

## 2018-11-22 LAB — MAGNESIUM
Magnesium: 1.7 mg/dL (ref 1.7–2.4)
Magnesium: 1.7 mg/dL (ref 1.7–2.4)

## 2018-11-22 MED ORDER — HEPARIN SODIUM (PORCINE) 5000 UNIT/ML IJ SOLN
5000.0000 [IU] | Freq: Three times a day (TID) | INTRAMUSCULAR | Status: DC
  Administered 2018-11-22 – 2018-11-24 (×5): 5000 [IU] via SUBCUTANEOUS
  Filled 2018-11-22 (×6): qty 1

## 2018-11-22 MED ORDER — POTASSIUM CHLORIDE CRYS ER 20 MEQ PO TBCR
40.0000 meq | EXTENDED_RELEASE_TABLET | Freq: Once | ORAL | Status: AC
  Administered 2018-11-22: 40 meq via ORAL
  Filled 2018-11-22: qty 2

## 2018-11-22 MED ORDER — ALPRAZOLAM 0.25 MG PO TABS
0.2500 mg | ORAL_TABLET | Freq: Once | ORAL | Status: AC
  Administered 2018-11-22: 0.25 mg via ORAL
  Filled 2018-11-22: qty 1

## 2018-11-22 MED ORDER — POTASSIUM CHLORIDE CRYS ER 20 MEQ PO TBCR
40.0000 meq | EXTENDED_RELEASE_TABLET | Freq: Two times a day (BID) | ORAL | Status: DC
  Administered 2018-11-22 – 2018-11-25 (×7): 40 meq via ORAL
  Filled 2018-11-22 (×8): qty 2

## 2018-11-22 MED ORDER — FUROSEMIDE 10 MG/ML IJ SOLN: 40.0000 mg | Freq: Every day | INTRAMUSCULAR | Status: DC

## 2018-11-22 MED ORDER — ATORVASTATIN CALCIUM 80 MG PO TABS
80.0000 mg | ORAL_TABLET | Freq: Every day | ORAL | Status: DC
  Administered 2018-11-23 – 2018-11-28 (×6): 80 mg via ORAL
  Filled 2018-11-22 (×6): qty 1

## 2018-11-22 MED ORDER — FUROSEMIDE 10 MG/ML IJ SOLN
40.0000 mg | Freq: Three times a day (TID) | INTRAMUSCULAR | Status: DC
  Administered 2018-11-22 – 2018-11-25 (×10): 40 mg via INTRAVENOUS
  Filled 2018-11-22 (×11): qty 4

## 2018-11-22 MED ORDER — ASPIRIN EC 81 MG PO TBEC
81.0000 mg | DELAYED_RELEASE_TABLET | Freq: Every day | ORAL | Status: DC
  Administered 2018-11-22 – 2018-11-28 (×6): 81 mg via ORAL
  Filled 2018-11-22 (×7): qty 1

## 2018-11-22 MED ORDER — LOSARTAN POTASSIUM 50 MG PO TABS
50.0000 mg | ORAL_TABLET | Freq: Every day | ORAL | Status: DC
  Administered 2018-11-23 – 2018-11-28 (×6): 50 mg via ORAL
  Filled 2018-11-22 (×6): qty 1

## 2018-11-22 NOTE — Evaluation (Signed)
Physical Therapy Evaluation Patient Details Name: Joy Dominguez MRN: [id number] DOB: November 08, 1941 Today's Date: 11/22/2018   History of Present Illness  77 y.o. female with medical history of CVA, dementia, HTN, HLD, A-fib, CHF, COPD, CKD 3, GERD admitted to [facility] center on 11/17/18 for acute respiratory failure due to CAP, septic shock requiring pressors. Also had A-fib with RVR which resolved. Noted to have minimally elevated troponin and cardiology suspected demand ischemia. On 11/19/18 she developed Torsades and a code blue was called. She was defibrillated, received compressions and 2 gm of Mg+ with ROSC. (She had a normal Mg+ level that morning.) She was intubated during the code and extubated 11/20/18. Cardiology recommended a cardiac cath for which she was transferred to [facility].     Clinical Impression  Pt admitted with above diagnosis. Pt currently with functional limitations due to the deficits listed below (see PT Problem List). On eval, pt required min assist bed mobility and min guard assist sitting EOB x 4 minutes. She declined OOB due to fatigue/feeling poorly. She states the doctor told her this morning that she had died and they brought her back.  She appears to be having a difficult time processing this information and repeated "I died" several times during the session. Pt will benefit from skilled PT to increase their independence and safety with mobility to allow discharge to the venue listed below.       Follow Up Recommendations SNF    Equipment Recommendations  Other (comment)(TBD)    Recommendations for Other Services       Precautions / Restrictions Precautions Precautions: Fall      Mobility  Bed Mobility Overal bed mobility: Needs Assistance Bed Mobility: Supine to Sit;Rolling;Sit to Supine Rolling: Min assist   Supine to sit: Min assist;HOB elevated Sit to supine: Min assist;HOB elevated   General bed mobility comments: +rail,  assist to elevate trunk and scoot to EOB  Transfers                 General transfer comment: Pt declining transfers/gait.  Ambulation/Gait             General Gait Details: Pt declining transfers/gait.  Stairs            Wheelchair Mobility    Modified Rankin (Stroke Patients Only)       Balance Overall balance assessment: Needs assistance Sitting-balance support: Feet supported;Single extremity supported Sitting balance-Leahy Scale: Fair Sitting balance - Comments: Pt sat EOB x 4 minutes min guard assist.                                     Pertinent Vitals/Pain Pain Assessment: Faces Faces Pain Scale: Hurts even more Pain Location: generalized Pain Descriptors / Indicators: Sore;Grimacing;Discomfort Pain Intervention(s): Monitored during session;Repositioned;Limited activity within patient's tolerance    Home Living Family/patient expects to be discharged to:: Unsure Living Arrangements: Children Available Help at Discharge: Family Type of Home: House Home Access: Ramped entrance     Home Layout: One level Home Equipment: Environmental consultant - 2 wheels;Other (comment)(home O2) Additional Comments: Per pt, she lives with 2 sons. Per EMS reports, there was no heat or electricity in the house and patient was sitting in her own feces.    Prior Function Level of Independence: Needs assistance   Gait / Transfers Assistance Needed: amb with RW, frequent falls (Pt reports not moving much lately because  she would stand up and fall backwards.)  ADL's / Homemaking Assistance Needed: pt independent with bathing and dressing, sons assist with iADLs  Comments: above information given by pt, unsure of the validity. Pt appears to be poor historian.     Hand Dominance        Extremity/Trunk Assessment   Upper Extremity Assessment Upper Extremity Assessment: RUE deficits/detail;LUE deficits/detail;Generalized weakness RUE Deficits / Details: limited  shoulder ROM to ~ 90 degrees LUE Deficits / Details: limited shoulder ROM to ~ 90 degrees    Lower Extremity Assessment Lower Extremity Assessment: Generalized weakness    Cervical / Trunk Assessment Cervical / Trunk Assessment: Kyphotic  Communication   Communication: No difficulties  Cognition Arousal/Alertness: Awake/alert Behavior During Therapy: WFL for tasks assessed/performed Overall Cognitive Status: No family/caregiver present to determine baseline cognitive functioning                                 General Comments: mild confusion, unsure of accuracy of information provided by pt      General Comments General comments (skin integrity, edema, etc.): HR 88, SpO2 94% on 2 L O2    Exercises     Assessment/Plan    PT Assessment Patient needs continued PT services  PT Problem List Decreased strength;Decreased balance;Decreased cognition;Pain;Decreased mobility;Decreased range of motion;Decreased activity tolerance       PT Treatment Interventions Functional mobility training;DME instruction;Balance training;Patient/family education;Gait training;Therapeutic activities;Therapeutic exercise;Cognitive remediation    PT Goals (Current goals can be found in the Care Plan section)  Acute Rehab PT Goals Patient Stated Goal: not stated PT Goal Formulation: With patient Time For Goal Achievement: 12/06/18 Potential to Achieve Goals: Fair    Frequency Min 3X/week   Barriers to discharge        Co-evaluation               AM-PAC PT "6 Clicks" Mobility  Outcome Measure Help needed turning from your back to your side while in a flat bed without using bedrails?: A Little Help needed moving from lying on your back to sitting on the side of a flat bed without using bedrails?: A Little Help needed moving to and from a bed to a chair (including a wheelchair)?: A Lot Help needed standing up from a chair using your arms (e.g., wheelchair or bedside  chair)?: A Little Help needed to walk in hospital room?: A Lot Help needed climbing 3-5 steps with a railing? : A Lot 6 Click Score: 15    End of Session   Activity Tolerance: Patient limited by fatigue Patient left: in bed;with call bell/phone within reach;with bed alarm set   PT Visit Diagnosis: Muscle weakness (generalized) (M62.81);Other abnormalities of gait and mobility (R26.89);Pain    Time: 1054-1110 PT Time Calculation (min) (ACUTE ONLY): 16 min   Charges:   PT Evaluation $PT Eval Moderate Complexity: 1 Mod          Lorrin Goodell, PT  Office # [phone] Pager [phone]   Lorriane Shire 11/22/2018, 12:13 PM

## 2018-11-22 NOTE — Progress Notes (Addendum)
PROGRESS NOTE    Joy Dominguez   OZH:[id number]  DOB: 11/19/1941  DOA: 11/20/2018 PCP: Welford Roche, NP   Brief Narrative:  Joy Dominguez  is a 77 y.o. female with medical history of CVA, dementia, HTN, HLD, A-fib, CHF, COPD, CKD 3, GERD admitted to [facility] center on 11/17/18 for acute respiratory failure due to CAP, septic shock requiring pressors. Also had A-fib with RVR which resolved. Noted to have minimally elevated troponin and cardiology suspected demand ischemia.  On 11/19/18 developed Torsades and a code blue was called. She was defibrillated, received compressions and 2 gm of Mg+ with ROSC. (she had a normal Mg+ and K+ level that morning). She was intubated during the code and extubated today. Cardiology recommended a cardiac cath for which she was transferred to [facility].    Subjective: Asking about going home- other wise no complaints.    Assessment & Plan:   Principal Problem:   Cardiac arrest with ventricular fibrillation-  Torsades de pointes  minimally elevated troponin - cont Amiodarone infusion - ECHO at [facility] has poor images- showed normal EF and impaired diastolic relaxation (done prior to cardiac arrest)- repeat ECHO shows normal EF - see below - transferred here for Cath-  cardiology to manage-   SVT vs V tach - she has a wide QRS at baseline- continue to replete electrolytes  Acute CHF, unspecifiec - crackles on exam,, CXR suggestive of pulm edem- have started Lasix IV - repeat ECHO pending- d/w Dr Meda Coffee    Benign essential HTN - 12/27- resumed Norvasc and increased dose- added Hydralazine  - Cozaar started today  Pneumonia -Septic shock H/o  COPD  -  - cont Albuterol Nebs - WBC count 18.3 on admission (in [place]) improved to 13.1 - on Cefepime since 11/17/18 with a recommended stop date of 11/24/18 by the attending physician   Abnormal movement of chest wall on exam - checked rib films which show deformities  due to prior fractures     AKI (acute kidney injury) - Cr 1.9 on admission to [facility]- improved to normal    HLD - cont statin    DVT prophylaxis: Heparin s/c Code Status: Full code Family Communication:  Disposition Plan: follow on telemetry Consultants:   cardiology Procedures:   ECHO  CPR/ Intubation  Repeat ECHO Left ventricle: The cavity size was normal. Systolic function was   vigorous. The estimated ejection fraction was in the range of 65%   to 70%. The study is not technically sufficient to allow   evaluation of LV diastolic function. Doppler parameters are   consistent with high ventricular filling pressure. Ratio of   mitral valve peak E velocity to medial annulus E (e&') velocity:   40.21. - Aortic valve: Mildly thickened leaflets. Mobility was not   restricted. Sclerosis without stenosis. Transvalvular velocity   was within the normal range. There was no stenosis. There was no   regurgitation. - Mitral valve: Calcified annulus. There was mild regurgitation.   Mean gradient (D): 5 mm Hg (HR 100 bpm). - Left atrium: The atrium was moderately dilated. - Right ventricle: The cavity size was mildly dilated. Wall   thickness was normal. Systolic function was normal. RV systolic   pressure (S, est): 46 mm Hg. - Right atrium: The atrium was mildly dilated. Central venous   pressure (est): 3 mm Hg. - Tricuspid valve: There was moderate regurgitation. - Pulmonary arteries: Systolic pressure was moderately increased. - Inferior vena cava: The  vessel was normal in size. The   respirophasic diameter changes were in the normal range (>= 50%),   consistent with normal central venous pressure. - Pericardium, extracardiac: A small pericardial effusion was   identified anterior to the heart. The fluid exhibited a fibrinous   appearance. There was a left pleural effusion.  Antimicrobials:  Anti-infectives (From admission, onward)   Start     Dose/Rate Route Frequency  Ordered Stop   11/21/18 1100  ceFEPIme (MAXIPIME) 1 g in sodium chloride 0.9 % 100 mL IVPB     1 g 200 mL/hr over 30 Minutes Intravenous Every 24 hours 11/20/18 1838         Objective: Vitals:   11/22/18 0500 11/22/18 0948 11/22/18 1207 11/22/18 1300  BP: (!) 166/60 (!) 149/53 (!) 118/47 (!) 132/47  Pulse: 91   88  Resp: (!) 22   (!) 22  Temp: 97.8 F (36.6 C)   98 F (36.7 C)  TempSrc: Oral   Axillary  SpO2: 100%   92%  Weight:      Height:        Intake/Output Summary (Last 24 hours) at 11/22/2018 1507 Last data filed at 11/22/2018 1300 Gross per 24 hour  Intake 864.09 ml  Output 2100 ml  Net -1235.91 ml   Filed Weights   11/21/18 1100  Weight: 48.1 kg    Examination: General exam: Appears comfortable  HEENT: PERRLA, oral mucosa moist, no sclera icterus or thrush Respiratory system: faint b/l crackles Cardiovascular system: S1 & S2 heard,  No murmurs  Gastrointestinal system: Abdomen soft, non-tender, nondistended. Normal bowel sound. No organomegaly Central nervous system: Alert and oriented. No focal neurological deficits. Extremities: No cyanosis, clubbing or edema Skin: No rashes or ulcers Psychiatry:  Mood & affect appropriate.    Data Reviewed: I have personally reviewed following labs and imaging studies  CBC: Recent Labs  Lab 11/21/18 0036 11/22/18 0320  WBC 13.7* 13.1*  HGB 10.0* 10.2*  HCT 31.6* 31.3*  MCV 91.3 90.2  PLT 284 671   Basic Metabolic Panel: Recent Labs  Lab 11/21/18 0036 11/22/18 0943 11/22/18 1222  NA 137 139  --   K 4.5 3.1*  --   CL 109 102  --   CO2 17* 26  --   GLUCOSE 91 147*  --   BUN 13 10  --   CREATININE 1.03* 0.85  --   CALCIUM 8.7* 8.1*  --   MG 1.9  --  1.7   GFR: Estimated Creatinine Clearance: 40.8 mL/min (by C-G formula based on SCr of 0.85 mg/dL). Liver Function Tests: No results for input(s): AST, ALT, ALKPHOS, BILITOT, PROT, ALBUMIN in the last 168 hours. No results for input(s): LIPASE,  AMYLASE in the last 168 hours. No results for input(s): AMMONIA in the last 168 hours. Coagulation Profile: No results for input(s): INR, PROTIME in the last 168 hours. Cardiac Enzymes: Recent Labs  Lab 11/20/18 2024 11/21/18 0036 11/21/18 0659  TROPONINI 0.29* 0.26* 0.16*   BNP (last 3 results) No results for input(s): PROBNP in the last 8760 hours. HbA1C: No results for input(s): HGBA1C in the last 72 hours. CBG: No results for input(s): GLUCAP in the last 168 hours. Lipid Profile: No results for input(s): CHOL, HDL, LDLCALC, TRIG, CHOLHDL, LDLDIRECT in the last 72 hours. Thyroid Function Tests: No results for input(s): TSH, T4TOTAL, FREET4, T3FREE, THYROIDAB in the last 72 hours. Anemia Panel: No results for input(s): VITAMINB12, FOLATE, FERRITIN, TIBC, IRON, RETICCTPCT in  the last 72 hours. Urine analysis: No results found for: COLORURINE, APPEARANCEUR, LABSPEC, PHURINE, GLUCOSEU, HGBUR, BILIRUBINUR, KETONESUR, PROTEINUR, UROBILINOGEN, NITRITE, LEUKOCYTESUR Sepsis Labs: @LABRCNTIP (procalcitonin:4,lacticidven:4) )No results found for this or any previous visit (from the past 240 hour(s)).       Radiology Studies: Dg Chest 2 View  Result Date: 11/20/2018 CLINICAL DATA:  Status post CPR EXAM: CHEST - 2 VIEW COMPARISON:  None. FINDINGS: Cardiac shadow is at the upper limits of normal in size. Aortic calcifications are seen. Mild central vascular congestion is noted as well as prominent pleural effusions left greater than right. No definitive acute bony abnormality is seen. Previously seen rib deformities are again noted and likely chronic. IMPRESSION: Vascular congestion and bilateral pleural effusions left greater than right. Electronically Signed   By: Inez Catalina M.D.   On: 11/20/2018 20:45   Dg Ribs Bilateral  Result Date: 11/20/2018 CLINICAL DATA:  Status post CPR, chest pain EXAM: BILATERAL RIBS - 3+ VIEW COMPARISON:  None. FINDINGS: Cardiac shadow is enlarged. Rib  deformities are noted bilaterally involving the third sixth seventh and eighth ribs on the left and the fifth rib on the right. These are likely related to prior fractures. No acute displaced fracture is noted. No pneumothorax is seen. Bilateral pleural effusions left greater than right are noted with central vascular congestion. IMPRESSION: Multiple rib deformities likely chronic in nature. Vascular congestion with bilateral effusions left greater than right. No pneumothorax is seen. Electronically Signed   By: Inez Catalina M.D.   On: 11/20/2018 20:37   Dg Chest Port 1 View  Result Date: 11/21/2018 CLINICAL DATA:  Respiratory distress. EXAM: PORTABLE CHEST 1 VIEW COMPARISON:  11/20/2018 FINDINGS: A left PICC has been placed and terminates over the mid to lower SVC. The cardiac silhouette is borderline enlarged. Aortic atherosclerosis is again noted. There is persistent pulmonary vascular congestion with patchy perihilar and bibasilar lung opacities, left slightly worse than right and similar to the prior study. Small pleural effusions, left larger than right, are also unchanged. No pneumothorax is identified. Bilateral rib fractures were more fully evaluated on yesterday's dedicated rib radiographs. IMPRESSION: 1. Left PICC terminates over the mid to lower SVC. 2. Unchanged pulmonary small pleural effusions, vascular congestion, and bilateral lung opacities which may reflect edema versus pneumonia. Electronically Signed   By: Logan Bores M.D.   On: 11/21/2018 17:56   [place] Ekg Site Rite  Result Date: 11/21/2018 If Site Rite image not attached, placement could not be confirmed due to current cardiac rhythm.     Scheduled Meds: . amLODipine  10 mg Oral Daily  . atorvastatin  10 mg Oral Daily  . feeding supplement (ENSURE ENLIVE)  237 mL Oral BID BM  . furosemide  40 mg Intravenous TID  . gabapentin  100 mg Oral QHS  . guaiFENesin  600 mg Oral BID  . hydrALAZINE  25 mg Oral Q6H  . lamoTRIgine  25  mg Oral TID  . losartan  25 mg Oral Daily  . multivitamin with minerals  1 tablet Oral Daily  . sodium chloride flush  10-40 mL Intracatheter Q12H  . sodium chloride flush  3 mL Intravenous Q12H   Continuous Infusions: . sodium chloride    . amiodarone 30 mg/hr (11/22/18 1255)  . ceFEPime (MAXIPIME) IV 1 g (11/22/18 1206)  . heparin 1,100 Units/hr (11/22/18 1323)     LOS: 2 days    Time spent in minutes: [place], MD [facility] Pager:  [email or web address] Password [facility] 11/22/2018, 3:07 PM

## 2018-11-22 NOTE — Progress Notes (Signed)
ANTICOAGULATION + ANTIBIOTIC CONSULT NOTE - Follow Up Consult  Pharmacy Consult for Heparin;  Cefepime Indication: atrial fibrillation;  HCAP  Allergies not on file  Patient Measurements: Height: 5\' 2"  (157.5 cm) Weight: 106 lb (48.1 kg)(at [facility]) IBW/kg (Calculated) : 50.1 Heparin Dosing Weight: 48 kg  Vital Signs: Temp: 98 F (36.7 C) (12/28 1300) Temp Source: Axillary (12/28 1300) BP: 132/47 (12/28 1300) Pulse Rate: 88 (12/28 1300)  Labs: Recent Labs    11/20/18 2024 11/21/18 0036 11/21/18 0659  11/21/18 1812 11/22/18 0320 11/22/18 0943 11/22/18 1222  HGB  --  10.0*  --   --   --  10.2*  --   --   HCT  --  31.6*  --   --   --  31.3*  --   --   PLT  --  284  --   --   --  361  --   --   HEPARINUNFRC  --  0.13*  --    < > <0.10* 0.23*  --  0.25*  CREATININE  --  1.03*  --   --   --   --  0.85  --   TROPONINI 0.29* 0.26* 0.16*  --   --   --   --   --    < > = values in this interval not displayed.    Estimated Creatinine Clearance: 40.8 mL/min (by C-G formula based on SCr of 0.85 mg/dL).  Assessment:  30 yof who is a direct transfer [employer] center. She presented to OSH with septic shock and Afib and had a code-blue event requiring intubation. Was extubated on 12/26 and transferred to [facility] for cardiac cath.  Heparin IV begun while at [facility].  Heparin level is slightly subtherapeutic at 0.25, on 1000 units/hr. Hgb stable at 10.2, plt 361. No s/sx of bleeding. No infusion issues.  Goal of Therapy:  Heparin level 0.3-0.7 units/ml Monitor platelets by anticoagulation protocol: Yes   Plan:  Increase heparin infusion to 1100 units/hr Monitor heparin level in 8 hours Monitor daily HL, CBC, and for s/sx of bleeding  Antonietta Jewel, PharmD, [place] Clinical Pharmacist  Pager: [phone] Phone: [phone]  11/22/2018 1:16 PM

## 2018-11-22 NOTE — Progress Notes (Addendum)
Progress Note  Patient Name: Joy Dominguez Date of Encounter: 11/22/2018  Primary Cardiologist: Ena Dawley, MD   Subjective   Still complains of occasional sharp CP.    Inpatient Medications    Scheduled Meds: . amLODipine  10 mg Oral Daily  . atorvastatin  10 mg Oral Daily  . feeding supplement (ENSURE ENLIVE)  237 mL Oral BID BM  . furosemide  40 mg Intravenous TID  . gabapentin  100 mg Oral QHS  . guaiFENesin  600 mg Oral BID  . hydrALAZINE  25 mg Oral Q6H  . lamoTRIgine  25 mg Oral TID  . losartan  25 mg Oral Daily  . multivitamin with minerals  1 tablet Oral Daily  . sodium chloride flush  10-40 mL Intracatheter Q12H  . sodium chloride flush  3 mL Intravenous Q12H   Continuous Infusions: . sodium chloride    . amiodarone 30 mg/hr (11/22/18 1255)  . ceFEPime (MAXIPIME) IV 1 g (11/22/18 1206)  . heparin 1,100 Units/hr (11/22/18 1323)   PRN Meds: sodium chloride, acetaminophen **OR** acetaminophen, albuterol, ibuprofen, sodium chloride flush, sodium chloride flush   Vital Signs    Vitals:   11/22/18 0500 11/22/18 0948 11/22/18 1207 11/22/18 1300  BP: (!) 166/60 (!) 149/53 (!) 118/47 (!) 132/47  Pulse: 91   88  Resp: (!) 22   (!) 22  Temp: 97.8 F (36.6 C)   98 F (36.7 C)  TempSrc: Oral   Axillary  SpO2: 100%   92%  Weight:      Height:        Intake/Output Summary (Last 24 hours) at 11/22/2018 1507 Last data filed at 11/22/2018 1300 Gross per 24 hour  Intake 864.09 ml  Output 2100 ml  Net -1235.91 ml   Filed Weights   11/21/18 1100  Weight: 48.1 kg    Telemetry  NSR with 12 beat run of WCT that appears to have started as a PAC so could be atrial tachycardia with aberration- Personally Reviewed  ECG  No new EKG to review- Personally Reviewed  Physical Exam   GEN: Elderly frail appearing WF in No acute distress  Neck: No JVD Cardiac: RRR, no murmurs, rubs, or gallops.  Respiratory: Crackles at bases bilaterally GI: Soft,  nontender, non-distended  MS: No edema; No deformity. Neuro:  Nonfocal  Psych: Normal affect   Labs    Chemistry Recent Labs  Lab 11/21/18 0036 11/22/18 0943  NA 137 139  K 4.5 3.1*  CL 109 102  CO2 17* 26  GLUCOSE 91 147*  BUN 13 10  CREATININE 1.03* 0.85  CALCIUM 8.7* 8.1*  GFRNONAA 52* >60  GFRAA 60* >60  ANIONGAP 11 11     Hematology Recent Labs  Lab 11/21/18 0036 11/22/18 0320  WBC 13.7* 13.1*  RBC 3.46* 3.47*  HGB 10.0* 10.2*  HCT 31.6* 31.3*  MCV 91.3 90.2  MCH 28.9 29.4  MCHC 31.6 32.6  RDW 12.4 12.4  PLT 284 361    Cardiac Enzymes Recent Labs  Lab 11/20/18 2024 11/21/18 0036 11/21/18 0659  TROPONINI 0.29* 0.26* 0.16*   No results for input(s): TROPIPOC in the last 168 hours.   BNPNo results for input(s): BNP, PROBNP in the last 168 hours.   DDimer No results for input(s): DDIMER in the last 168 hours.   Radiology    Dg Chest 2 View  Result Date: 11/20/2018 CLINICAL DATA:  Status post CPR EXAM: CHEST - 2 VIEW COMPARISON:  None.  FINDINGS: Cardiac shadow is at the upper limits of normal in size. Aortic calcifications are seen. Mild central vascular congestion is noted as well as prominent pleural effusions left greater than right. No definitive acute bony abnormality is seen. Previously seen rib deformities are again noted and likely chronic. IMPRESSION: Vascular congestion and bilateral pleural effusions left greater than right. Electronically Signed   By: Inez Catalina M.D.   On: 11/20/2018 20:45   Dg Ribs Bilateral  Result Date: 11/20/2018 CLINICAL DATA:  Status post CPR, chest pain EXAM: BILATERAL RIBS - 3+ VIEW COMPARISON:  None. FINDINGS: Cardiac shadow is enlarged. Rib deformities are noted bilaterally involving the third sixth seventh and eighth ribs on the left and the fifth rib on the right. These are likely related to prior fractures. No acute displaced fracture is noted. No pneumothorax is seen. Bilateral pleural effusions left greater  than right are noted with central vascular congestion. IMPRESSION: Multiple rib deformities likely chronic in nature. Vascular congestion with bilateral effusions left greater than right. No pneumothorax is seen. Electronically Signed   By: Inez Catalina M.D.   On: 11/20/2018 20:37   Dg Chest Port 1 View  Result Date: 11/21/2018 CLINICAL DATA:  Respiratory distress. EXAM: PORTABLE CHEST 1 VIEW COMPARISON:  11/20/2018 FINDINGS: A left PICC has been placed and terminates over the mid to lower SVC. The cardiac silhouette is borderline enlarged. Aortic atherosclerosis is again noted. There is persistent pulmonary vascular congestion with patchy perihilar and bibasilar lung opacities, left slightly worse than right and similar to the prior study. Small pleural effusions, left larger than right, are also unchanged. No pneumothorax is identified. Bilateral rib fractures were more fully evaluated on yesterday's dedicated rib radiographs. IMPRESSION: 1. Left PICC terminates over the mid to lower SVC. 2. Unchanged pulmonary small pleural effusions, vascular congestion, and bilateral lung opacities which may reflect edema versus pneumonia. Electronically Signed   By: Logan Bores M.D.   On: 11/21/2018 17:56   [place] Ekg Site Rite  Result Date: 11/21/2018 If Site Rite image not attached, placement could not be confirmed due to current cardiac rhythm.   Cardiac Studies   ECHO: 11/17/18 Oval Linsey) suboptimal views, no LV measurements obtained.  EF 65-70%, impaired relaxation noted.  Left atrium mildly dilated and right atrium mildly enlarged.  Aortic valve not well visualized but mean gradient 8 mm and peak gradient 19 mmHg, MR noted RV pressure 35 mmHg.  F/u Post Arrest Echo 11/21/2018.  Left ventricle: The cavity size was normal. Systolic function was   vigorous. The estimated ejection fraction was in the range of 65%   to 70%. The study is not technically sufficient to allow   evaluation of LV diastolic  function. Doppler parameters are   consistent with high ventricular filling pressure. Ratio of   mitral valve peak E velocity to medial annulus E (e&') velocity:   40.21. - Aortic valve: Mildly thickened leaflets. Mobility was not   restricted. Sclerosis without stenosis. Transvalvular velocity   was within the normal range. There was no stenosis. There was no   regurgitation. - Mitral valve: Calcified annulus. There was mild regurgitation.   Mean gradient (D): 5 mm Hg (HR 100 bpm). - Left atrium: The atrium was moderately dilated. - Right ventricle: The cavity size was mildly dilated. Wall   thickness was normal. Systolic function was normal. RV systolic   pressure (S, est): 46 mm Hg. - Right atrium: The atrium was mildly dilated. Central venous   pressure (est):  3 mm Hg. - Tricuspid valve: There was moderate regurgitation. - Pulmonary arteries: Systolic pressure was moderately increased. - Inferior vena cava: The vessel was normal in size. The   respirophasic diameter changes were in the normal range (>= 50%),   consistent with normal central venous pressure. - Pericardium, extracardiac: A small pericardial effusion was   identified anterior to the heart. The fluid exhibited a fibrinous   appearance. There was a left pleural effusion.    Patient Profile     Ms. Bodie is a 77 y/o female who was initially admitted to [facility] on 12/23 for shortness of breath, sepsis, CAP, elevated troponin, acute respiratory failure 2/2 COPD, CHF, & PNA and also had AKI on CKD. She was noted to have a prolonged QT and ultimately had cardiac arrest,  VT versus V. fib, possible torsades. She required CPR, got Mg 2 gm, epi x1, and defib x 1 w/ ROSC. Was intubated. Troponin post arrest was 0.69 and she was transferred to [facility] for further management.   Assessment & Plan    1. Cardiac Arrest/ NSTEMI: per records obtained from OSH she was noted to have a prolonged QT initially on admit EKGs and  later developed cardiac arrest, VT versus V. Fib/ possible torsades. She required CPR, got Mg 2 gm, epi x1, and defib x 1 w/ ROSC. Was intubated. Troponin post arrest was 0.69. Transferred to [facility].  -Troponins here are trending downward, 0.26>>0.26>>0.16. - 2D echo with normal LVF EF 65-70% with increased LV filling pressure with E/e' 40. -She reports recent CP but atypical, described as sharp pain. She is being treated for PNA.  -She has multiple risk factors for CAD including known PVD, HTN and HLD. -Given her COPD and wheezing on exam, I don't think she would be a good candidate for chemical stress test w/ Lexiscan. -plan for right and left heart cath on Monday -No BB due to wheezing -start ASA 81mg  daily -increase Lipitor to 80mg  daily   2. ? VT: now on amiodarone.  -She had a brief run of WCT earlier today but started with a PAC and could be SVT with aberration. - Continue to monitor closely. K+ 3.1 this am.  Need to replete to keep Mg >2.0 and K > 4.0.  - Plan likely cardiac cath  Monday to assess for obstructive CAD.  -2D echo with normal LVF -RV mildly dilated - if cath shows no CAD then would consider cardiac MRI to rule out RV dysplasia  3. Prolonged QT: avoid QT prolonging agents.   4. PNA: management per IM.   5. HTN:  - BP controlled today - Continue amlodipine, losartan - would rather push ARB first instead of adding Hydralazine.  ARB will help with hypokalemia in the setting of diuresis.  - stop Hydralazine and increase Losartan to 50mg  daily as renal function remains stable.   6.  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  Hypokalemia - replete to keep K>4 - mag 1.7 - replete to keep > 2 - start Kdur 69meq BID -check BMET now as she had a K+ of 3.1 and only received 29meq this am  8.  Acute diastolic CHF - she put out 1.8L yesterday and is net neg 1 L - still has crackles in lungs - She was on  lasix 40mg  IV TID and not sure how lasix got decreased to 40mg  IV daily.  Will increase back to 40mg  IV TID.  -follow I&Os and daily weight  I have spent a total of 45 minutes with patient reviewing 2D echo , telemetry, EKGs, labs and examining patient as well as establishing an assessment and plan that was discussed with the patient.  > 50% of time was spent in direct patient care.    For questions or updates, please contact [place] Please consult [email or web address] for contact info under     Signed, Fransico Him, MD  11/22/2018, 3:07 PM

## 2018-11-22 NOTE — Progress Notes (Signed)
ANTICOAGULATION CONSULT NOTE - Follow Up Consult  Pharmacy Consult for heparin Indication: atrial fibrillation  Labs: Recent Labs    11/20/18 2024  11/21/18 0036 11/21/18 0659 11/21/18 1006 11/21/18 1812 11/22/18 0320  HGB  --   --  10.0*  --   --   --  10.2*  HCT  --   --  31.6*  --   --   --  31.3*  PLT  --   --  284  --   --   --  361  HEPARINUNFRC  --    < > 0.13*  --  0.33 <0.10* 0.23*  CREATININE  --   --  1.03*  --   --   --   --   TROPONINI 0.29*  --  0.26* 0.16*  --   --   --    < > = values in this interval not displayed.    Assessment: 77yo female subtherapeutic on heparin after resumed s/p PICC placement; no gtt issues or signs of bleeding per RN.  Goal of Therapy:  Heparin level 0.3-0.7 units/ml   Plan:  Will increase heparin gtt by 2 units/kg/hr to 1000 units/hr and check level in 8 hours.    Wynona Neat, PharmD, BCPS  11/22/2018,4:17 AM

## 2018-11-23 ENCOUNTER — Encounter (HOSPITAL_COMMUNITY): Payer: Self-pay | Admitting: *Deleted

## 2018-11-23 ENCOUNTER — Inpatient Hospital Stay (HOSPITAL_COMMUNITY): Payer: Medicare Other

## 2018-11-23 DIAGNOSIS — I4819 Other persistent atrial fibrillation: Secondary | ICD-10-CM

## 2018-11-23 LAB — BASIC METABOLIC PANEL
Anion gap: 11 (ref 5–15)
BUN: 12 mg/dL (ref 8–23)
CHLORIDE: 98 mmol/L (ref 98–111)
CO2: 30 mmol/L (ref 22–32)
Calcium: 8.7 mg/dL — ABNORMAL LOW (ref 8.9–10.3)
Creatinine, Ser: 1 mg/dL (ref 0.44–1.00)
GFR calc Af Amer: >60 mL/min (ref >60)
GFR calc non Af Amer: 54 mL/min — ABNORMAL LOW (ref >60)
Glucose, Bld: 176 mg/dL — ABNORMAL HIGH (ref 70–99)
Potassium: 4.1 mmol/L (ref 3.5–5.1)
Sodium: 139 mmol/L (ref 135–145)

## 2018-11-23 LAB — CBC
HCT: 31.4 % — ABNORMAL LOW (ref 36.0–46.0)
Hemoglobin: 10.1 g/dL — ABNORMAL LOW (ref 12.0–15.0)
MCH: 28.9 pg (ref 26.0–34.0)
MCHC: 32.2 g/dL (ref 30.0–36.0)
MCV: 90 fL (ref 80.0–100.0)
Platelets: 415 10*3/uL — ABNORMAL HIGH (ref 150–400)
RBC: 3.49 MIL/uL — ABNORMAL LOW (ref 3.87–5.11)
RDW: 12.5 % (ref 11.5–15.5)
WBC: 11.2 10*3/uL — ABNORMAL HIGH (ref 4.0–10.5)
nRBC: 0 % (ref 0.0–0.2)

## 2018-11-23 LAB — LIPID PANEL
Cholesterol: 159 mg/dL (ref 0–200)
HDL: 39 mg/dL — ABNORMAL LOW (ref >40)
LDL CALC: 106 mg/dL — AB (ref 0–99)
Total CHOL/HDL Ratio: 4.1 RATIO
Triglycerides: 70 mg/dL (ref <150)
VLDL: 14 mg/dL (ref 0–40)

## 2018-11-23 LAB — TROPONIN I
Troponin I: 0.11 ng/mL (ref <0.03)
Troponin I: 0.1 ng/mL (ref <0.03)
Troponin I: 0.12 ng/mL (ref <0.03)

## 2018-11-23 MED ORDER — ASPIRIN 81 MG PO CHEW
81.0000 mg | CHEWABLE_TABLET | ORAL | Status: AC
  Administered 2018-11-24: 81 mg via ORAL
  Filled 2018-11-23: qty 1

## 2018-11-23 MED ORDER — MORPHINE SULFATE (PF) 2 MG/ML IV SOLN
2.0000 mg | Freq: Once | INTRAVENOUS | Status: AC | PRN
  Administered 2018-11-23: 2 mg via INTRAVENOUS
  Filled 2018-11-23: qty 1

## 2018-11-23 MED ORDER — SODIUM CHLORIDE 0.9% FLUSH
3.0000 mL | Freq: Two times a day (BID) | INTRAVENOUS | Status: DC
  Administered 2018-11-23 – 2018-11-24 (×3): 3 mL via INTRAVENOUS

## 2018-11-23 MED ORDER — SODIUM CHLORIDE 0.9% FLUSH: 3.0000 mL | INTRAVENOUS | Status: DC | PRN

## 2018-11-23 MED ORDER — SODIUM CHLORIDE 0.9 % IV SOLN: 250.0000 mL | INTRAVENOUS | Status: DC | PRN

## 2018-11-23 MED ORDER — SODIUM CHLORIDE 0.9 % WEIGHT BASED INFUSION
1.0000 mL/kg/h | INTRAVENOUS | Status: DC
  Administered 2018-11-24: 1 mL/kg/h via INTRAVENOUS

## 2018-11-23 MED ORDER — SODIUM CHLORIDE 0.9 % WEIGHT BASED INFUSION
3.0000 mL/kg/h | INTRAVENOUS | Status: DC
  Administered 2018-11-23: 3 mL/kg/h via INTRAVENOUS

## 2018-11-23 NOTE — H&P (View-Only) (Signed)
Progress Note  Patient Name: Adley Castello Date of Encounter: 11/23/2018  Primary Cardiologist: Ena Dawley, MD   Subjective   Complains of discomfort between her shoulder blades today and in her upper abdomen.  Inpatient Medications    Scheduled Meds: . amLODipine  10 mg Oral Daily  . aspirin EC  81 mg Oral Daily  . atorvastatin  80 mg Oral Daily  . feeding supplement (ENSURE ENLIVE)  237 mL Oral BID BM  . furosemide  40 mg Intravenous Q8H  . gabapentin  100 mg Oral QHS  . guaiFENesin  600 mg Oral BID  . heparin injection (subcutaneous)  5,000 Units Subcutaneous Q8H  . lamoTRIgine  25 mg Oral TID  . losartan  50 mg Oral Daily  . multivitamin with minerals  1 tablet Oral Daily  . potassium chloride  40 mEq Oral BID  . sodium chloride flush  10-40 mL Intracatheter Q12H  . sodium chloride flush  3 mL Intravenous Q12H   Continuous Infusions: . sodium chloride    . amiodarone 30 mg/hr (11/23/18 0023)  . ceFEPime (MAXIPIME) IV 1 g (11/22/18 1206)   PRN Meds: sodium chloride, acetaminophen **OR** acetaminophen, albuterol, ibuprofen, sodium chloride flush, sodium chloride flush   Vital Signs    Vitals:   11/22/18 1300 11/22/18 2200 11/23/18 0644 11/23/18 0845  BP: (!) 132/47 (!) 138/58 (!) 150/59 (!) 148/56  Pulse: 88 82 78   Resp: (!) 22 (!) 23 (!) 30   Temp: 98 F (36.7 C) 97.7 F (36.5 C) 98 F (36.7 C)   TempSrc: Axillary Oral Oral   SpO2: 92% 95% 95%   Weight:      Height:        Intake/Output Summary (Last 24 hours) at 11/23/2018 0093 Last data filed at 11/23/2018 0845 Gross per 24 hour  Intake 480.67 ml  Output 1000 ml  Net -519.33 ml   Filed Weights   11/21/18 1100  Weight: 48.1 kg    Telemetry  NSR nonsustained atrial tachycardia with aberration- Personally Reviewed  ECG  No new EKG to review- Personally Reviewed  Physical Exam   GEN: Well nourished, well developed in no acute distress HEENT: Normal NECK: No JVD; No  carotid bruits LYMPHATICS: No lymphadenopathy CARDIAC:RRR, no murmurs, rubs, gallops RESPIRATORY: Diffuse expiratory wheezes with crackles at bases bilaterally posteriorly ABDOMEN: Soft, non-tender, non-distended MUSCULOSKELETAL:  No edema; No deformity  SKIN: Warm and dry NEUROLOGIC:  Alert and oriented x 3 PSYCHIATRIC:  Normal affect    Labs    Chemistry Recent Labs  Lab 11/22/18 0943 11/22/18 1637 11/23/18 0755  NA 139 140 139  K 3.1* 3.5 4.1  CL 102 103 98  CO2 26 29 30   GLUCOSE 147* 131* 176*  BUN 10 13 12   CREATININE 0.85 0.83 1.00  CALCIUM 8.1* 8.8* 8.7*  GFRNONAA >60 >60 54*  GFRAA >60 >60 >60  ANIONGAP 11 8 11      Hematology Recent Labs  Lab 11/21/18 0036 11/22/18 0320 11/23/18 0755  WBC 13.7* 13.1* 11.2*  RBC 3.46* 3.47* 3.49*  HGB 10.0* 10.2* 10.1*  HCT 31.6* 31.3* 31.4*  MCV 91.3 90.2 90.0  MCH 28.9 29.4 28.9  MCHC 31.6 32.6 32.2  RDW 12.4 12.4 12.5  PLT 284 361 415*    Cardiac Enzymes Recent Labs  Lab 11/20/18 2024 11/21/18 0036 11/21/18 0659  TROPONINI 0.29* 0.26* 0.16*   No results for input(s): TROPIPOC in the last 168 hours.   BNPNo results for  input(s): BNP, PROBNP in the last 168 hours.   DDimer No results for input(s): DDIMER in the last 168 hours.   Radiology    Dg Chest Port 1 View  Result Date: 11/21/2018 CLINICAL DATA:  Respiratory distress. EXAM: PORTABLE CHEST 1 VIEW COMPARISON:  11/20/2018 FINDINGS: A left PICC has been placed and terminates over the mid to lower SVC. The cardiac silhouette is borderline enlarged. Aortic atherosclerosis is again noted. There is persistent pulmonary vascular congestion with patchy perihilar and bibasilar lung opacities, left slightly worse than right and similar to the prior study. Small pleural effusions, left larger than right, are also unchanged. No pneumothorax is identified. Bilateral rib fractures were more fully evaluated on yesterday's dedicated rib radiographs. IMPRESSION: 1. Left  PICC terminates over the mid to lower SVC. 2. Unchanged pulmonary small pleural effusions, vascular congestion, and bilateral lung opacities which may reflect edema versus pneumonia. Electronically Signed   By: Logan Bores M.D.   On: 11/21/2018 17:56   [place] Ekg Site Rite  Result Date: 11/21/2018 If Site Rite image not attached, placement could not be confirmed due to current cardiac rhythm.   Cardiac Studies   ECHO: 11/17/18 Oval Linsey) suboptimal views, no LV measurements obtained.  EF 65-70%, impaired relaxation noted.  Left atrium mildly dilated and right atrium mildly enlarged.  Aortic valve not well visualized but mean gradient 8 mm and peak gradient 19 mmHg, MR noted RV pressure 35 mmHg.  F/u Post Arrest Echo 11/21/2018.  Left ventricle: The cavity size was normal. Systolic function was   vigorous. The estimated ejection fraction was in the range of 65%   to 70%. The study is not technically sufficient to allow   evaluation of LV diastolic function. Doppler parameters are   consistent with high ventricular filling pressure. Ratio of   mitral valve peak E velocity to medial annulus E (e&') velocity:   40.21. - Aortic valve: Mildly thickened leaflets. Mobility was not   restricted. Sclerosis without stenosis. Transvalvular velocity   was within the normal range. There was no stenosis. There was no   regurgitation. - Mitral valve: Calcified annulus. There was mild regurgitation.   Mean gradient (D): 5 mm Hg (HR 100 bpm). - Left atrium: The atrium was moderately dilated. - Right ventricle: The cavity size was mildly dilated. Wall   thickness was normal. Systolic function was normal. RV systolic   pressure (S, est): 46 mm Hg. - Right atrium: The atrium was mildly dilated. Central venous   pressure (est): 3 mm Hg. - Tricuspid valve: There was moderate regurgitation. - Pulmonary arteries: Systolic pressure was moderately increased. - Inferior vena cava: The vessel was normal in size.  The   respirophasic diameter changes were in the normal range (>= 50%),   consistent with normal central venous pressure. - Pericardium, extracardiac: A small pericardial effusion was   identified anterior to the heart. The fluid exhibited a fibrinous   appearance. There was a left pleural effusion.    Patient Profile     Ms. Perrow is a 77 y/o female who was initially admitted to [facility] on 12/23 for shortness of breath, sepsis, CAP, elevated troponin, acute respiratory failure 2/2 COPD, CHF, & PNA and also had AKI on CKD. She was noted to have a prolonged QT and ultimately had cardiac arrest,  VT versus V. fib, possible torsades. She required CPR, got Mg 2 gm, epi x1, and defib x 1 w/ ROSC. Was intubated. Troponin post arrest was 0.69 and  she was transferred to [facility] for further management.   Assessment & Plan    1. Cardiac Arrest/ NSTEMI: per records obtained from OSH she was noted to have a prolonged QT initially on admit EKGs and later developed cardiac arrest, VT versus V. Fib/ possible torsades. She required CPR, got Mg 2 gm, epi x1, and defib x 1 w/ ROSC. Was intubated. Troponin post arrest was 0.69. Transferred to [facility].  -Troponins here are trending downward, 0.26>>0.26>>0.16. - 2D echo with normal LVF EF 65-70% with increased LV filling pressure with E/e' 40. -She reports recent CP but atypical, described as sharp pain. She is being treated for PNA.  -Today she complains of upper abdominal pressure as well as pressure between her shoulder blades.  I will repeat troponins to make sure these are not trending upward -She has multiple risk factors for CAD including known PVD, HTN and HLD.  -Given her COPD and wheezing on exam, I don't think she would be a good candidate for chemical stress test w/ Lexiscan. -Right and left heart cath planned for tomorrow -No BB due to wheezing -Continue ASA 81mg  daily and Lipitor to 80mg  daily   2. ? VT: now on amiodarone.  - Continue to  monitor closely.  Potassium improved this morning to 4.1 after repletion.  Need to keep Mg >2.0 and K > 4.0.  -Assess for CAD by cath on Monday -2D echo with normal LVF -RV mildly dilated - if cath shows no CAD then would consider cardiac MRI to rule out RV dysplasia  3. Prolonged QT: avoid QT prolonging agents.   4. PNA: -Still has significant wheezing and crackles at left base  management per IM.   5. HTN:  - BP elevated today at 148/56 -150/59 mmHg - Continue amlodipine 10 mg daily - Losartan increased to 50 mg daily starting today  6.  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  Hypokalemia -Potassium 4.1 this morning after starting Kdur 53meq BID  8.  Acute diastolic CHF - She put out 1 L yesterday and is net -1.2 L - still has crackles in lungs - Continue Lasix 40mg  IV TID. - PA and lateral chest x-ray done this morning is pending - follow I&Os and daily weight - Creatinine remained stable at 1  I have spent a total of 35 minutes with patient reviewing telemetry, EKGs, labs and examining patient as well as establishing an assessment and plan that was discussed with the patient.  > 50% of time was spent in direct patient care.    For questions or updates, please contact [place] Please consult [email or web address] for contact info under     Signed, Fransico Him, MD  11/23/2018, 9:25 AM

## 2018-11-23 NOTE — Plan of Care (Signed)
  Problem: Clinical Measurements: Goal: Ability to maintain clinical measurements within normal limits will improve Outcome: Progressing   Problem: Clinical Measurements: Goal: Respiratory complications will improve Outcome: Progressing   Problem: Clinical Measurements: Goal: Cardiovascular complication will be avoided Outcome: Progressing   

## 2018-11-23 NOTE — Progress Notes (Signed)
PROGRESS NOTE    Joy Dominguez   BTD:[id number]  DOB: 01-29-1941  DOA: 11/20/2018 PCP: Welford Roche, NP   Brief Narrative:  Joy Dominguez  is a 77 y.o. female with medical history of CVA, dementia, HTN, HLD, A-fib, CHF, COPD, CKD 3, GERD admitted to [facility] center on 11/17/18 for acute respiratory failure due to CAP, septic shock requiring pressors. Also had A-fib with RVR which resolved. Noted to have minimally elevated troponin and cardiology suspected demand ischemia.  On 11/19/18 developed Torsades and a code blue was called. She was defibrillated, received compressions and 2 gm of Mg+ with ROSC. (she had a normal Mg+ and K+ level that morning). She was intubated during the code and extubated today. Cardiology recommended a cardiac cath for which she was transferred to [facility].    Subjective: Remained confused to place, time and situation. No complaints for me.    Assessment & Plan:   Principal Problem:   Cardiac arrest with ventricular fibrillation-  Torsades de pointes  minimally elevated troponin - cont Amiodarone infusion - ECHO at [facility] has poor images- showed normal EF and impaired diastolic relaxation (done prior to cardiac arrest)- repeat ECHO shows normal EF - see below - transferred here for Cath-  cardiology managing  SVT vs V tach - per cardiology  Acute CHF, unspecifiec - crackles on exam, CXR suggestive of pulm edem-  -  ECHO noted below - normal EF- difficult to tell if diastolic dysfunction - diuresis per cardiology    Benign essential HTN - 12/27- resumed Norvasc and increased dose- added Hydralazine  - cardiology subsequently managing medications  Pneumonia -Septic shock H/o  COPD  -  - cont Albuterol Nebs - WBC count 18.3 on admission (in [place]) improved to 13.1 - on Cefepime since 11/17/18 with a recommended stop date of 11/24/18 by the attending physician which I agree with   Abnormal movement of chest wall  on exam - checked rib films which show deformities due to prior fractures     AKI (acute kidney injury) - Cr 1.9 on admission to [facility]- improved to normal    HLD - cont statin  Dementia  - unreliable historian - no behavioral disturbances noted    DVT prophylaxis: Heparin s/c Code Status: Full code Family Communication:  Disposition Plan: follow on telemetry Consultants:   cardiology Procedures:   ECHO  CPR/ Intubation  Repeat ECHO Left ventricle: The cavity size was normal. Systolic function was   vigorous. The estimated ejection fraction was in the range of 65%   to 70%. The study is not technically sufficient to allow   evaluation of LV diastolic function. Doppler parameters are   consistent with high ventricular filling pressure. Ratio of   mitral valve peak E velocity to medial annulus E (e&') velocity:   40.21. - Aortic valve: Mildly thickened leaflets. Mobility was not   restricted. Sclerosis without stenosis. Transvalvular velocity   was within the normal range. There was no stenosis. There was no   regurgitation. - Mitral valve: Calcified annulus. There was mild regurgitation.   Mean gradient (D): 5 mm Hg (HR 100 bpm). - Left atrium: The atrium was moderately dilated. - Right ventricle: The cavity size was mildly dilated. Wall   thickness was normal. Systolic function was normal. RV systolic   pressure (S, est): 46 mm Hg. - Right atrium: The atrium was mildly dilated. Central venous   pressure (est): 3 mm Hg. - Tricuspid valve: There  was moderate regurgitation. - Pulmonary arteries: Systolic pressure was moderately increased. - Inferior vena cava: The vessel was normal in size. The   respirophasic diameter changes were in the normal range (>= 50%),   consistent with normal central venous pressure. - Pericardium, extracardiac: A small pericardial effusion was   identified anterior to the heart. The fluid exhibited a fibrinous   appearance. There was a  left pleural effusion.  Antimicrobials:  Anti-infectives (From admission, onward)   Start     Dose/Rate Route Frequency Ordered Stop   11/21/18 1100  ceFEPIme (MAXIPIME) 1 g in sodium chloride 0.9 % 100 mL IVPB     1 g 200 mL/hr over 30 Minutes Intravenous Every 24 hours 11/20/18 1838         Objective: Vitals:   11/22/18 1300 11/22/18 2200 11/23/18 0644 11/23/18 0845  BP: (!) 132/47 (!) 138/58 (!) 150/59 (!) 148/56  Pulse: 88 82 78   Resp: (!) 22 (!) 23 (!) 30   Temp: 98 F (36.7 C) 97.7 F (36.5 C) 98 F (36.7 C)   TempSrc: Axillary Oral Oral   SpO2: 92% 95% 95%   Weight:      Height:        Intake/Output Summary (Last 24 hours) at 11/23/2018 1417 Last data filed at 11/23/2018 1132 Gross per 24 hour  Intake 770.84 ml  Output 700 ml  Net 70.84 ml   Filed Weights   11/21/18 1100  Weight: 48.1 kg    Examination: General exam: Appears comfortable  HEENT: PERRLA, oral mucosa moist, no sclera icterus or thrush Respiratory system: faint basilar crackles today- Respiratory effort normal. Cardiovascular system: S1 & S2 heard,  No murmurs  Gastrointestinal system: Abdomen soft, non-tender, nondistended. Normal bowel sound. No organomegaly Central nervous system: Alert and oriented only to person No focal neurological deficits. Extremities: No cyanosis, clubbing or edema Skin: No rashes or ulcers Psychiatry:  Mood & affect appropriate.     Data Reviewed: I have personally reviewed following labs and imaging studies  CBC: Recent Labs  Lab 11/21/18 0036 11/22/18 0320 11/23/18 0755  WBC 13.7* 13.1* 11.2*  HGB 10.0* 10.2* 10.1*  HCT 31.6* 31.3* 31.4*  MCV 91.3 90.2 90.0  PLT 284 361 191*   Basic Metabolic Panel: Recent Labs  Lab 11/21/18 0036 11/22/18 0943 11/22/18 1222 11/22/18 1637 11/23/18 0755  NA 137 139  --  140 139  K 4.5 3.1*  --  3.5 4.1  CL 109 102  --  103 98  CO2 17* 26  --  29 30  GLUCOSE 91 147*  --  131* 176*  BUN 13 10  --  13 12    CREATININE 1.03* 0.85  --  0.83 1.00  CALCIUM 8.7* 8.1*  --  8.8* 8.7*  MG 1.9  --  1.7 1.7  --    GFR: Estimated Creatinine Clearance: 34.6 mL/min (by C-G formula based on SCr of 1 mg/dL). Liver Function Tests: No results for input(s): AST, ALT, ALKPHOS, BILITOT, PROT, ALBUMIN in the last 168 hours. No results for input(s): LIPASE, AMYLASE in the last 168 hours. No results for input(s): AMMONIA in the last 168 hours. Coagulation Profile: No results for input(s): INR, PROTIME in the last 168 hours. Cardiac Enzymes: Recent Labs  Lab 11/20/18 2024 11/21/18 0036 11/21/18 0659 11/23/18 1133  TROPONINI 0.29* 0.26* 0.16* 0.11*   BNP (last 3 results) No results for input(s): PROBNP in the last 8760 hours. HbA1C: No results for input(s): HGBA1C  in the last 72 hours. CBG: No results for input(s): GLUCAP in the last 168 hours. Lipid Profile: Recent Labs    11/23/18 0755  CHOL 159  HDL 39*  LDLCALC 106*  TRIG 70  CHOLHDL 4.1   Thyroid Function Tests: No results for input(s): TSH, T4TOTAL, FREET4, T3FREE, THYROIDAB in the last 72 hours. Anemia Panel: No results for input(s): VITAMINB12, FOLATE, FERRITIN, TIBC, IRON, RETICCTPCT in the last 72 hours. Urine analysis: No results found for: COLORURINE, APPEARANCEUR, LABSPEC, PHURINE, GLUCOSEU, HGBUR, BILIRUBINUR, KETONESUR, PROTEINUR, UROBILINOGEN, NITRITE, LEUKOCYTESUR Sepsis Labs: @LABRCNTIP (procalcitonin:4,lacticidven:4) )No results found for this or any previous visit (from the past 240 hour(s)).       Radiology Studies: Dg Chest 2 View  Result Date: 11/23/2018 CLINICAL DATA:  Pt admitted a few days ago for PNA. C/o continued SOB, chest pains and productive cough. EXAM: CHEST - 2 VIEW COMPARISON:  11/21/2018 FINDINGS: LEFT-sided PICC line tip overlies the superior vena cava. Heart size is normal. There is persistent opacity of both lung bases, LEFT greater than RIGHT. The LEFT hemidiaphragm is completely obscured. LEFT  pleural effusion. There is atherosclerotic calcification of the thoracic aorta. IMPRESSION: Persistent bibasilar opacities, LEFT greater than RIGHT. Electronically Signed   By: Nolon Nations M.D.   On: 11/23/2018 11:46   Dg Chest Port 1 View  Result Date: 11/21/2018 CLINICAL DATA:  Respiratory distress. EXAM: PORTABLE CHEST 1 VIEW COMPARISON:  11/20/2018 FINDINGS: A left PICC has been placed and terminates over the mid to lower SVC. The cardiac silhouette is borderline enlarged. Aortic atherosclerosis is again noted. There is persistent pulmonary vascular congestion with patchy perihilar and bibasilar lung opacities, left slightly worse than right and similar to the prior study. Small pleural effusions, left larger than right, are also unchanged. No pneumothorax is identified. Bilateral rib fractures were more fully evaluated on yesterday's dedicated rib radiographs. IMPRESSION: 1. Left PICC terminates over the mid to lower SVC. 2. Unchanged pulmonary small pleural effusions, vascular congestion, and bilateral lung opacities which may reflect edema versus pneumonia. Electronically Signed   By: Logan Bores M.D.   On: 11/21/2018 17:56   [place] Ekg Site Rite  Result Date: 11/21/2018 If Site Rite image not attached, placement could not be confirmed due to current cardiac rhythm.     Scheduled Meds: . amLODipine  10 mg Oral Daily  . [START ON 11/24/2018] aspirin  81 mg Oral Pre-Cath  . aspirin EC  81 mg Oral Daily  . atorvastatin  80 mg Oral Daily  . feeding supplement (ENSURE ENLIVE)  237 mL Oral BID BM  . furosemide  40 mg Intravenous Q8H  . gabapentin  100 mg Oral QHS  . guaiFENesin  600 mg Oral BID  . heparin injection (subcutaneous)  5,000 Units Subcutaneous Q8H  . lamoTRIgine  25 mg Oral TID  . losartan  50 mg Oral Daily  . multivitamin with minerals  1 tablet Oral Daily  . potassium chloride  40 mEq Oral BID  . sodium chloride flush  10-40 mL Intracatheter Q12H  . sodium chloride  flush  3 mL Intravenous Q12H  . sodium chloride flush  3 mL Intravenous Q12H   Continuous Infusions: . sodium chloride 250 mL (11/23/18 1130)  . sodium chloride    . [START ON 11/24/2018] sodium chloride     Followed by  . [START ON 11/24/2018] sodium chloride    . amiodarone 30 mg/hr (11/23/18 1316)  . ceFEPime (MAXIPIME) IV 1 g (11/23/18 1131)  LOS: 3 days    Time spent in minutes: [place], MD [facility] Pager: [email or web address] Password [email or web address] 11/23/2018, 2:17 PM

## 2018-11-23 NOTE — Progress Notes (Addendum)
Progress Note  Patient Name: Joy Dominguez Date of Encounter: 11/23/2018  Primary Cardiologist: Ena Dawley, MD   Subjective   Complains of discomfort between her shoulder blades today and in her upper abdomen.  Inpatient Medications    Scheduled Meds: . amLODipine  10 mg Oral Daily  . aspirin EC  81 mg Oral Daily  . atorvastatin  80 mg Oral Daily  . feeding supplement (ENSURE ENLIVE)  237 mL Oral BID BM  . furosemide  40 mg Intravenous Q8H  . gabapentin  100 mg Oral QHS  . guaiFENesin  600 mg Oral BID  . heparin injection (subcutaneous)  5,000 Units Subcutaneous Q8H  . lamoTRIgine  25 mg Oral TID  . losartan  50 mg Oral Daily  . multivitamin with minerals  1 tablet Oral Daily  . potassium chloride  40 mEq Oral BID  . sodium chloride flush  10-40 mL Intracatheter Q12H  . sodium chloride flush  3 mL Intravenous Q12H   Continuous Infusions: . sodium chloride    . amiodarone 30 mg/hr (11/23/18 0023)  . ceFEPime (MAXIPIME) IV 1 g (11/22/18 1206)   PRN Meds: sodium chloride, acetaminophen **OR** acetaminophen, albuterol, ibuprofen, sodium chloride flush, sodium chloride flush   Vital Signs    Vitals:   11/22/18 1300 11/22/18 2200 11/23/18 0644 11/23/18 0845  BP: (!) 132/47 (!) 138/58 (!) 150/59 (!) 148/56  Pulse: 88 82 78   Resp: (!) 22 (!) 23 (!) 30   Temp: 98 F (36.7 C) 97.7 F (36.5 C) 98 F (36.7 C)   TempSrc: Axillary Oral Oral   SpO2: 92% 95% 95%   Weight:      Height:        Intake/Output Summary (Last 24 hours) at 11/23/2018 5573 Last data filed at 11/23/2018 0845 Gross per 24 hour  Intake 480.67 ml  Output 1000 ml  Net -519.33 ml   Filed Weights   11/21/18 1100  Weight: 48.1 kg    Telemetry  NSR nonsustained atrial tachycardia with aberration- Personally Reviewed  ECG  No new EKG to review- Personally Reviewed  Physical Exam   GEN: Well nourished, well developed in no acute distress HEENT: Normal NECK: No JVD; No  carotid bruits LYMPHATICS: No lymphadenopathy CARDIAC:RRR, no murmurs, rubs, gallops RESPIRATORY: Diffuse expiratory wheezes with crackles at bases bilaterally posteriorly ABDOMEN: Soft, non-tender, non-distended MUSCULOSKELETAL:  No edema; No deformity  SKIN: Warm and dry NEUROLOGIC:  Alert and oriented x 3 PSYCHIATRIC:  Normal affect    Labs    Chemistry Recent Labs  Lab 11/22/18 0943 11/22/18 1637 11/23/18 0755  NA 139 140 139  K 3.1* 3.5 4.1  CL 102 103 98  CO2 26 29 30   GLUCOSE 147* 131* 176*  BUN 10 13 12   CREATININE 0.85 0.83 1.00  CALCIUM 8.1* 8.8* 8.7*  GFRNONAA >60 >60 54*  GFRAA >60 >60 >60  ANIONGAP 11 8 11      Hematology Recent Labs  Lab 11/21/18 0036 11/22/18 0320 11/23/18 0755  WBC 13.7* 13.1* 11.2*  RBC 3.46* 3.47* 3.49*  HGB 10.0* 10.2* 10.1*  HCT 31.6* 31.3* 31.4*  MCV 91.3 90.2 90.0  MCH 28.9 29.4 28.9  MCHC 31.6 32.6 32.2  RDW 12.4 12.4 12.5  PLT 284 361 415*    Cardiac Enzymes Recent Labs  Lab 11/20/18 2024 11/21/18 0036 11/21/18 0659  TROPONINI 0.29* 0.26* 0.16*   No results for input(s): TROPIPOC in the last 168 hours.   BNPNo results for  input(s): BNP, PROBNP in the last 168 hours.   DDimer No results for input(s): DDIMER in the last 168 hours.   Radiology    Dg Chest Port 1 View  Result Date: 11/21/2018 CLINICAL DATA:  Respiratory distress. EXAM: PORTABLE CHEST 1 VIEW COMPARISON:  11/20/2018 FINDINGS: A left PICC has been placed and terminates over the mid to lower SVC. The cardiac silhouette is borderline enlarged. Aortic atherosclerosis is again noted. There is persistent pulmonary vascular congestion with patchy perihilar and bibasilar lung opacities, left slightly worse than right and similar to the prior study. Small pleural effusions, left larger than right, are also unchanged. No pneumothorax is identified. Bilateral rib fractures were more fully evaluated on yesterday's dedicated rib radiographs. IMPRESSION: 1. Left  PICC terminates over the mid to lower SVC. 2. Unchanged pulmonary small pleural effusions, vascular congestion, and bilateral lung opacities which may reflect edema versus pneumonia. Electronically Signed   By: Logan Bores M.D.   On: 11/21/2018 17:56   [place] Ekg Site Rite  Result Date: 11/21/2018 If Site Rite image not attached, placement could not be confirmed due to current cardiac rhythm.   Cardiac Studies   ECHO: 11/17/18 [place]) suboptimal views, no LV measurements obtained.  EF 65-70%, impaired relaxation noted.  Left atrium mildly dilated and right atrium mildly enlarged.  Aortic valve not well visualized but mean gradient 8 mm and peak gradient 19 mmHg, MR noted RV pressure 35 mmHg.  F/u Post Arrest Echo 11/21/2018.  Left ventricle: The cavity size was normal. Systolic function was   vigorous. The estimated ejection fraction was in the range of 65%   to 70%. The study is not technically sufficient to allow   evaluation of LV diastolic function. Doppler parameters are   consistent with high ventricular filling pressure. Ratio of   mitral valve peak E velocity to medial annulus E (e&') velocity:   40.21. - Aortic valve: Mildly thickened leaflets. Mobility was not   restricted. Sclerosis without stenosis. Transvalvular velocity   was within the normal range. There was no stenosis. There was no   regurgitation. - Mitral valve: Calcified annulus. There was mild regurgitation.   Mean gradient (D): 5 mm Hg (HR 100 bpm). - Left atrium: The atrium was moderately dilated. - Right ventricle: The cavity size was mildly dilated. Wall   thickness was normal. Systolic function was normal. RV systolic   pressure (S, est): 46 mm Hg. - Right atrium: The atrium was mildly dilated. Central venous   pressure (est): 3 mm Hg. - Tricuspid valve: There was moderate regurgitation. - Pulmonary arteries: Systolic pressure was moderately increased. - Inferior vena cava: The vessel was normal in size.  The   respirophasic diameter changes were in the normal range (>= 50%),   consistent with normal central venous pressure. - Pericardium, extracardiac: A small pericardial effusion was   identified anterior to the heart. The fluid exhibited a fibrinous   appearance. There was a left pleural effusion.    Patient Profile     Ms. Courtney is a 77 y/o female who was initially admitted to [facility] on 12/23 for shortness of breath, sepsis, CAP, elevated troponin, acute respiratory failure 2/2 COPD, CHF, & PNA and also had AKI on CKD. She was noted to have a prolonged QT and ultimately had cardiac arrest,  VT versus V. fib, possible torsades. She required CPR, got Mg 2 gm, epi x1, and defib x 1 w/ ROSC. Was intubated. Troponin post arrest was 0.69 and  she was transferred to [facility] for further management.   Assessment & Plan    1. Cardiac Arrest/ NSTEMI: per records obtained from OSH she was noted to have a prolonged QT initially on admit EKGs and later developed cardiac arrest, VT versus V. Fib/ possible torsades. She required CPR, got Mg 2 gm, epi x1, and defib x 1 w/ ROSC. Was intubated. Troponin post arrest was 0.69. Transferred to [facility].  -Troponins here are trending downward, 0.26>>0.26>>0.16. - 2D echo with normal LVF EF 65-70% with increased LV filling pressure with E/e' 40. -She reports recent CP but atypical, described as sharp pain. She is being treated for PNA.  -Today she complains of upper abdominal pressure as well as pressure between her shoulder blades.  I will repeat troponins to make sure these are not trending upward -She has multiple risk factors for CAD including known PVD, HTN and HLD.  -Given her COPD and wheezing on exam, I don't think she would be a good candidate for chemical stress test w/ Lexiscan. -Right and left heart cath planned for tomorrow -No BB due to wheezing -Continue ASA 81mg  daily and Lipitor to 80mg  daily   2. ? VT: now on amiodarone.  - Continue to  monitor closely.  Potassium improved this morning to 4.1 after repletion.  Need to keep Mg >2.0 and K > 4.0.  -Assess for CAD by cath on Monday -2D echo with normal LVF -RV mildly dilated - if cath shows no CAD then would consider cardiac MRI to rule out RV dysplasia  3. Prolonged QT: avoid QT prolonging agents.   4. PNA: -Still has significant wheezing and crackles at left base  management per IM.   5. HTN:  - BP elevated today at 148/56 -150/59 mmHg - Continue amlodipine 10 mg daily - Losartan increased to 50 mg daily starting today  6.  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  Hypokalemia -Potassium 4.1 this morning after starting Kdur 41meq BID  8.  Acute diastolic CHF - She put out 1 L yesterday and is net -1.2 L - still has crackles in lungs - Continue Lasix 40mg  IV TID. - PA and lateral chest x-ray done this morning is pending - follow I&Os and daily weight - Creatinine remained stable at 1  I have spent a total of 35 minutes with patient reviewing telemetry, EKGs, labs and examining patient as well as establishing an assessment and plan that was discussed with the patient.  > 50% of time was spent in direct patient care.    For questions or updates, please contact [place] Please consult [email or web address] for contact info under     Signed, Fransico Him, MD  11/23/2018, 9:25 AM

## 2018-11-23 NOTE — Progress Notes (Signed)
Patient refusing to ambulate, educated on reasons for ambulation, states " I am just not up for it today".  Will continue to encourage to ambulate and get OOB.

## 2018-11-23 NOTE — Progress Notes (Signed)
Paged Cards Fellow on-call, about patients slightly increased troponin and continual chest pain.   Give patient 2mg  Morphine per Dr. Paticia Stack.   Joy Dominguez

## 2018-11-24 ENCOUNTER — Encounter (HOSPITAL_COMMUNITY): Payer: Self-pay | Admitting: Internal Medicine

## 2018-11-24 ENCOUNTER — Encounter (HOSPITAL_COMMUNITY)
Admission: AD | Disposition: A | Discharge status: Home Health | Payer: Self-pay | Source: Other Acute Inpatient Hospital | Attending: Internal Medicine

## 2018-11-24 DIAGNOSIS — I214 Non-ST elevation (NSTEMI) myocardial infarction: Secondary | ICD-10-CM

## 2018-11-24 HISTORY — PX: RIGHT/LEFT HEART CATH AND CORONARY ANGIOGRAPHY: CATH118266

## 2018-11-24 LAB — CBC
HCT: 30.8 % — ABNORMAL LOW (ref 36.0–46.0)
Hemoglobin: 9.6 g/dL — ABNORMAL LOW (ref 12.0–15.0)
MCH: 28.5 pg (ref 26.0–34.0)
MCHC: 31.2 g/dL (ref 30.0–36.0)
MCV: 91.4 fL (ref 80.0–100.0)
Platelets: 429 10*3/uL — ABNORMAL HIGH (ref 150–400)
RBC: 3.37 MIL/uL — ABNORMAL LOW (ref 3.87–5.11)
RDW: 12.7 % (ref 11.5–15.5)
WBC: 12.3 10*3/uL — AB (ref 4.0–10.5)
nRBC: 0 % (ref 0.0–0.2)

## 2018-11-24 LAB — BASIC METABOLIC PANEL
Anion gap: 10 (ref 5–15)
BUN: 16 mg/dL (ref 8–23)
CO2: 31 mmol/L (ref 22–32)
Calcium: 8.8 mg/dL — ABNORMAL LOW (ref 8.9–10.3)
Chloride: 97 mmol/L — ABNORMAL LOW (ref 98–111)
Creatinine, Ser: 1.05 mg/dL — ABNORMAL HIGH (ref 0.44–1.00)
GFR calc Af Amer: 58 mL/min — ABNORMAL LOW (ref >60)
GFR calc non Af Amer: 50 mL/min — ABNORMAL LOW (ref >60)
Glucose, Bld: 146 mg/dL — ABNORMAL HIGH (ref 70–99)
Potassium: 4.8 mmol/L (ref 3.5–5.1)
Sodium: 138 mmol/L (ref 135–145)

## 2018-11-24 LAB — POCT I-STAT 3, VENOUS BLOOD GAS (G3P V)
Acid-Base Excess: 9 mmol/L — ABNORMAL HIGH (ref 0.0–2.0)
Bicarbonate: 34.2 mmol/L — ABNORMAL HIGH (ref 20.0–28.0)
O2 Saturation: 70 %
TCO2: 36 mmol/L — ABNORMAL HIGH (ref 22–32)
pCO2, Ven: 46.9 mmHg (ref 44.0–60.0)
pH, Ven: 7.471 — ABNORMAL HIGH (ref 7.250–7.430)
pO2, Ven: 35 mmHg (ref 32.0–45.0)

## 2018-11-24 LAB — POCT I-STAT 3, ART BLOOD GAS (G3+)
Acid-Base Excess: 8 mmol/L — ABNORMAL HIGH (ref 0.0–2.0)
Bicarbonate: 32.5 mmol/L — ABNORMAL HIGH (ref 20.0–28.0)
O2 Saturation: 97 %
TCO2: 34 mmol/L — AB (ref 22–32)
pCO2 arterial: 42.5 mmHg (ref 32.0–48.0)
pH, Arterial: 7.491 — ABNORMAL HIGH (ref 7.350–7.450)
pO2, Arterial: 89 mmHg (ref 83.0–108.0)

## 2018-11-24 LAB — MAGNESIUM: Magnesium: 1.5 mg/dL — ABNORMAL LOW (ref 1.7–2.4)

## 2018-11-24 SURGERY — RIGHT/LEFT HEART CATH AND CORONARY ANGIOGRAPHY
Anesthesia: LOCAL

## 2018-11-24 MED ORDER — SODIUM CHLORIDE 0.9% FLUSH: 3.0000 mL | INTRAVENOUS | Status: DC | PRN

## 2018-11-24 MED ORDER — SODIUM CHLORIDE 0.9 % IV SOLN: 250.0000 mL | INTRAVENOUS | Status: DC | PRN

## 2018-11-24 MED ORDER — HEPARIN (PORCINE) IN NACL 1000-0.9 UT/500ML-% IV SOLN
INTRAVENOUS | Status: DC | PRN
  Administered 2018-11-24 (×2): 500 mL

## 2018-11-24 MED ORDER — SODIUM CHLORIDE 0.9 % IV SOLN: INTRAVENOUS | Status: AC

## 2018-11-24 MED ORDER — LIDOCAINE HCL (PF) 1 % IJ SOLN
INTRAMUSCULAR | Status: DC | PRN
  Administered 2018-11-24: 15 mL

## 2018-11-24 MED ORDER — FENTANYL CITRATE (PF) 100 MCG/2ML IJ SOLN
INTRAMUSCULAR | Status: DC | PRN
  Administered 2018-11-24: 12.5 ug via INTRAVENOUS

## 2018-11-24 MED ORDER — HEPARIN (PORCINE) IN NACL 1000-0.9 UT/500ML-% IV SOLN
INTRAVENOUS | Status: AC
  Filled 2018-11-24: qty 1000

## 2018-11-24 MED ORDER — VERAPAMIL HCL 2.5 MG/ML IV SOLN
INTRAVENOUS | Status: AC
  Filled 2018-11-24: qty 2

## 2018-11-24 MED ORDER — OLOPATADINE HCL 0.1 % OP SOLN
1.0000 [drp] | Freq: Every day | OPHTHALMIC | Status: DC
  Administered 2018-11-24 – 2018-11-28 (×5): 1 [drp] via OPHTHALMIC
  Filled 2018-11-24: qty 5

## 2018-11-24 MED ORDER — SODIUM CHLORIDE 0.9% FLUSH
3.0000 mL | Freq: Two times a day (BID) | INTRAVENOUS | Status: DC
  Administered 2018-11-24 – 2018-11-28 (×4): 3 mL via INTRAVENOUS

## 2018-11-24 MED ORDER — TRAZODONE HCL 50 MG PO TABS
50.0000 mg | ORAL_TABLET | Freq: Every evening | ORAL | Status: DC | PRN
  Administered 2018-11-24 – 2018-11-27 (×4): 50 mg via ORAL
  Filled 2018-11-24 (×4): qty 1

## 2018-11-24 MED ORDER — HEPARIN SODIUM (PORCINE) 5000 UNIT/ML IJ SOLN
5000.0000 [IU] | Freq: Three times a day (TID) | INTRAMUSCULAR | Status: DC
  Administered 2018-11-24 – 2018-11-28 (×12): 5000 [IU] via SUBCUTANEOUS
  Filled 2018-11-24 (×11): qty 1

## 2018-11-24 MED ORDER — FENTANYL CITRATE (PF) 100 MCG/2ML IJ SOLN
INTRAMUSCULAR | Status: AC
  Filled 2018-11-24: qty 2

## 2018-11-24 MED ORDER — LIDOCAINE HCL (PF) 1 % IJ SOLN
INTRAMUSCULAR | Status: AC
  Filled 2018-11-24: qty 30

## 2018-11-24 MED ORDER — MAGNESIUM SULFATE 4 GM/100ML IV SOLN
4.0000 g | Freq: Once | INTRAVENOUS | Status: AC
  Administered 2018-11-24: 4 g via INTRAVENOUS
  Filled 2018-11-24: qty 100

## 2018-11-24 MED ORDER — SODIUM CHLORIDE 0.9 % IV SOLN
INTRAVENOUS | Status: DC | PRN
  Administered 2018-11-24: 10 mL/h via INTRAVENOUS

## 2018-11-24 MED ORDER — MIDAZOLAM HCL 2 MG/2ML IJ SOLN
INTRAMUSCULAR | Status: DC | PRN
  Administered 2018-11-24: 0.5 mg via INTRAVENOUS

## 2018-11-24 MED ORDER — MIDAZOLAM HCL 2 MG/2ML IJ SOLN
INTRAMUSCULAR | Status: AC
  Filled 2018-11-24: qty 2

## 2018-11-24 MED ORDER — IOHEXOL 350 MG/ML SOLN
INTRAVENOUS | Status: DC | PRN
  Administered 2018-11-24: 50 mL via INTRAVENOUS

## 2018-11-24 SURGICAL SUPPLY — 11 items
CATH INFINITI 5FR MULTPACK ANG (CATHETERS) ×2 IMPLANT
CATH SWAN GANZ 7F STRAIGHT (CATHETERS) ×2 IMPLANT
KIT HEART LEFT (KITS) ×2 IMPLANT
KIT MICROPUNCTURE NIT STIFF (SHEATH) ×2 IMPLANT
PACK CARDIAC CATHETERIZATION (CUSTOM PROCEDURE TRAY) ×2 IMPLANT
SHEATH PINNACLE 5F 10CM (SHEATH) ×2 IMPLANT
SHEATH PINNACLE 7F 10CM (SHEATH) ×2 IMPLANT
SHEATH PROBE COVER 6X72 (BAG) ×2 IMPLANT
TRANSDUCER W/STOPCOCK (MISCELLANEOUS) ×2 IMPLANT
TUBING CIL FLEX 10 FLL-RA (TUBING) ×2 IMPLANT
WIRE EMERALD 3MM-J .035X150CM (WIRE) ×2 IMPLANT

## 2018-11-24 NOTE — [place] FL2 (Signed)
[place] MEDICAID FL2 LEVEL OF CARE SCREENING TOOL     IDENTIFICATION  Patient Name: Joy Dominguez Birthdate: Apr 24, 1941 Sex: female Admission Date (Current Location): 11/20/2018  [facility] and [place] Number:  Herbalist and Address:  [facility] [place] [address], [place], [place] [address]      Provider Number: [id number]  Attending Physician Name and Address:  Debbe Odea, MD  Relative Name and Phone Number:       Current Level of Care: Hospital Recommended Level of Care: [place] Prior Approval Number:    Date Approved/Denied:   PASRR Number: [id number]  Discharge Plan: SNF    Current Diagnoses: Patient Active Problem List   Diagnosis Date Noted  . Non-ST elevation (NSTEMI) myocardial infarction ([place])   . Malnutrition of moderate degree 11/21/2018  . CAP (community acquired pneumonia) 11/20/2018  . Torsades de pointes ([place]) 11/20/2018  . Cardiac arrest with ventricular fibrillation ([place]) 11/20/2018  . COPD (chronic obstructive pulmonary disease) ([place]) 11/20/2018  . Septic shock ([place]) 11/20/2018  . Demand ischemia ([place]) 11/20/2018  . AKI (acute kidney injury) ([place]) 11/20/2018  . Atrial fibrillation ([place]) 11/20/2018  . Benign essential HTN 11/20/2018  . HLD (hyperlipidemia) 11/20/2018  . GERD (gastroesophageal reflux disease) 11/20/2018  . Hyponatremia 11/20/2018    Orientation RESPIRATION BLADDER Height & Weight     Self, Time, Situation, Place  Normal Incontinent, External catheter Weight: 49.7 kg Height:  5\' 2"  (157.5 cm)  BEHAVIORAL SYMPTOMS/MOOD NEUROLOGICAL BOWEL NUTRITION STATUS      Continent Diet(please see DC summary)  AMBULATORY STATUS COMMUNICATION OF NEEDS Skin   Extensive Assist Verbally Normal                       Personal Care Assistance Level of Assistance  Bathing, Feeding, Dressing Bathing Assistance: Limited assistance Feeding assistance: Independent Dressing  Assistance: Limited assistance     Functional Limitations Info  Sight, Hearing, Speech Sight Info: Adequate Hearing Info: Adequate Speech Info: Adequate    SPECIAL CARE FACTORS FREQUENCY  PT (By licensed PT)     PT Frequency: 5x/week              Contractures Contractures Info: Not present    Additional Factors Info  Code Status, Allergies Code Status Info: Full Allergies Info: Penicillins           Current Medications (11/24/2018):  This is the current hospital active medication list Current Facility-Administered Medications  Medication Dose Route Frequency Provider Last Rate Last Dose  . 0.9 %  sodium chloride infusion  250 mL Intravenous PRN End, Harrell Gave, MD   Stopped at 11/23/18 1512  . 0.9 %  sodium chloride infusion   Intravenous Continuous End, Christopher, MD 50 mL/hr at 11/24/18 1146    . 0.9 %  sodium chloride infusion  250 mL Intravenous PRN End, Harrell Gave, MD      . acetaminophen (TYLENOL) tablet 650 mg  650 mg Oral Q6H PRN End, Harrell Gave, MD   650 mg at 11/23/18 1823   Or  . acetaminophen (TYLENOL) suppository 650 mg  650 mg Rectal Q6H PRN End, Harrell Gave, MD      . albuterol (PROVENTIL) (2.5 MG/3ML) 0.083% nebulizer solution 2.5 mg  2.5 mg Nebulization Q6H PRN End, Harrell Gave, MD      . amiodarone (NEXTERONE PREMIX) 360-4.14 MG/200ML-% (1.8 mg/mL) IV infusion  30 mg/hr Intravenous Continuous End, Christopher, MD 16.67 mL/hr at 11/23/18 2200 30 mg/hr at  11/23/18 2200  . amLODipine (NORVASC) tablet 10 mg  10 mg Oral Daily End, Christopher, MD   10 mg at 11/24/18 0940  . aspirin EC tablet 81 mg  81 mg Oral Daily End, Christopher, MD   81 mg at 11/23/18 0845  . atorvastatin (LIPITOR) tablet 80 mg  80 mg Oral Daily End, Christopher, MD   80 mg at 11/24/18 0939  . ceFEPIme (MAXIPIME) 1 g in sodium chloride 0.9 % 100 mL IVPB  1 g Intravenous Q24H End, Christopher, MD 200 mL/hr at 11/24/18 1246 1 g at 11/24/18 1246  . feeding supplement (ENSURE ENLIVE)  (ENSURE ENLIVE) liquid 237 mL  237 mL Oral BID BM End, Christopher, MD   237 mL at 11/23/18 1513  . furosemide (LASIX) injection 40 mg  40 mg Intravenous Q8H End, Harrell Gave, MD   Stopped at 11/24/18 [phone]  . gabapentin (NEURONTIN) capsule 100 mg  100 mg Oral QHS End, Christopher, MD   100 mg at 11/23/18 2154  . guaiFENesin (MUCINEX) 12 hr tablet 600 mg  600 mg Oral BID End, Christopher, MD   600 mg at 11/24/18 0940  . heparin injection 5,000 Units  5,000 Units Subcutaneous Q8H End, Christopher, MD      . ibuprofen (ADVIL,MOTRIN) tablet 400 mg  400 mg Oral Q6H PRN End, Harrell Gave, MD   400 mg at 11/23/18 1955  . lamoTRIgine (LAMICTAL) tablet 25 mg  25 mg Oral TID End, Christopher, MD   25 mg at 11/24/18 0940  . losartan (COZAAR) tablet 50 mg  50 mg Oral Daily End, Christopher, MD   50 mg at 11/24/18 0940  . multivitamin with minerals tablet 1 tablet  1 tablet Oral Daily End, Christopher, MD   1 tablet at 11/24/18 0940  . potassium chloride SA (K-DUR,KLOR-CON) CR tablet 40 mEq  40 mEq Oral BID End, Christopher, MD   40 mEq at 11/24/18 0939  . sodium chloride flush (NS) 0.9 % injection 10-40 mL  10-40 mL Intracatheter Q12H End, Christopher, MD   10 mL at 11/24/18 0937  . sodium chloride flush (NS) 0.9 % injection 10-40 mL  10-40 mL Intracatheter PRN End, Harrell Gave, MD      . sodium chloride flush (NS) 0.9 % injection 3 mL  3 mL Intravenous Q12H End, Christopher, MD   3 mL at 11/24/18 0956  . sodium chloride flush (NS) 0.9 % injection 3 mL  3 mL Intravenous PRN End, Harrell Gave, MD      . sodium chloride flush (NS) 0.9 % injection 3 mL  3 mL Intravenous Q12H End, Christopher, MD      . sodium chloride flush (NS) 0.9 % injection 3 mL  3 mL Intravenous PRN End, Harrell Gave, MD      . traZODone (DESYREL) tablet 50 mg  50 mg Oral QHS PRN End, Harrell Gave, MD         Discharge Medications: Please see discharge summary for a list of discharge medications.  Relevant Imaging Results:  Relevant Lab  Results:   Additional Information SSN: [id number]  Estanislado Emms, LCSW

## 2018-11-24 NOTE — Progress Notes (Signed)
PROGRESS NOTE    Aniela Caniglia   HDQ:[id number]  DOB: 03/27/1941  DOA: 11/20/2018 PCP: Welford Roche, NP   Brief Narrative:  Joy Dominguez  is a 77 y.o. female with medical history of CVA, dementia, HTN, HLD, A-fib, CHF, COPD, CKD 3, GERD admitted to [facility] center on 11/17/18 for acute respiratory failure due to CAP, septic shock requiring pressors. Also had A-fib with RVR which resolved. Noted to have minimally elevated troponin and cardiology suspected demand ischemia.  On 11/19/18 developed Torsades and a code blue was called. She was defibrillated, received compressions and 2 gm of Mg+ with ROSC. (she had a normal Mg+ and K+ level that morning). She was intubated during the code and extubated today. Cardiology recommended a cardiac cath for which she was transferred to [facility].    Subjective: Much more oriented today. Feels anxious.    Assessment & Plan:   Principal Problem:   Cardiac arrest with ventricular fibrillation-  Torsades de pointes  minimally elevated troponin - ECHO at [facility] has poor images- showed normal EF and impaired diastolic relaxation (done prior to cardiac arrest)- repeat ECHO shows normal EF - see below - transferred here for Cath-  cardiology managing- cath results reveiwed  SVT vs V tach - per cardiology  Acute CHF, unspecifiec - crackles on exam, CXR suggestive of pulm edem-  -  ECHO noted below - normal EF- difficult to tell if diastolic dysfunction - diuresis per cardiology    Benign essential HTN - 12/27- resumed Norvasc and increased dose- added Hydralazine  - cardiology subsequently managing medications  Pneumonia -Septic shock H/o  COPD  -  - cont Albuterol Nebs - WBC count 18.3 on admission (in [place]) improved to 13.1 and then 12.3 - on Cefepime since 11/17/18 with a recommended stop date of 11/24/18 by the attending physician which I agree with- cough is minimal now-  will d/c Cefepime today    Abnormal movement of chest wall on exam - checked rib films which show deformities due to prior fractures     AKI (acute kidney injury) - Cr 1.9 on admission to [facility]- improved to normal    HLD - cont statin  Anxiety/ depression - unfortunately in setting of prolonged QTc, home medications had to be held- home med list obtained from our pharmacy notes that she is on Celexa, Effexor, Trazodone and Lamictal-  The patient does not remember what she takes - Lamictal resumed-  -QTc now is 474- if OK with cardiology, can resume either Celexa or Effexor today - she feels like her "nerves " are acting up and RN is asking for something to calm her down but I am hesitant to give benzodiazepines to an elderly lady-   Delirium - has improved  GERD - on Protonix BID at home- on hold for prolonged QTc    DVT prophylaxis: Heparin s/c Code Status: Full code Family Communication:  Disposition Plan: follow on telemetry Consultants:   cardiology Procedures:   ECHO  CPR/ Intubation  Repeat ECHO Left ventricle: The cavity size was normal. Systolic function was   vigorous. The estimated ejection fraction was in the range of 65%   to 70%. The study is not technically sufficient to allow   evaluation of LV diastolic function. Doppler parameters are   consistent with high ventricular filling pressure. Ratio of   mitral valve peak E velocity to medial annulus E (e&') velocity:   40.21. - Aortic valve: Mildly thickened leaflets.  Mobility was not   restricted. Sclerosis without stenosis. Transvalvular velocity   was within the normal range. There was no stenosis. There was no   regurgitation. - Mitral valve: Calcified annulus. There was mild regurgitation.   Mean gradient (D): 5 mm Hg (HR 100 bpm). - Left atrium: The atrium was moderately dilated. - Right ventricle: The cavity size was mildly dilated. Wall   thickness was normal. Systolic function was normal. RV systolic   pressure (S,  est): 46 mm Hg. - Right atrium: The atrium was mildly dilated. Central venous   pressure (est): 3 mm Hg. - Tricuspid valve: There was moderate regurgitation. - Pulmonary arteries: Systolic pressure was moderately increased. - Inferior vena cava: The vessel was normal in size. The   respirophasic diameter changes were in the normal range (>= 50%),   consistent with normal central venous pressure. - Pericardium, extracardiac: A small pericardial effusion was   identified anterior to the heart. The fluid exhibited a fibrinous   appearance. There was a left pleural effusion.  Antimicrobials:  Anti-infectives (From admission, onward)   Start     Dose/Rate Route Frequency Ordered Stop   11/21/18 1100  ceFEPIme (MAXIPIME) 1 g in sodium chloride 0.9 % 100 mL IVPB     1 g 200 mL/hr over 30 Minutes Intravenous Every 24 hours 11/20/18 1838         Objective: Vitals:   11/24/18 1227 11/24/18 1242 11/24/18 1257 11/24/18 1312  BP: (!) 148/63 139/61 (!) 139/52 128/65  Pulse: 80 74 76 75  Resp: (!) 22 19 20 19   Temp:      TempSrc:      SpO2: 97% 97% 98% 97%  Weight:      Height:        Intake/Output Summary (Last 24 hours) at 11/24/2018 1547 Last data filed at 11/24/2018 1400 Gross per 24 hour  Intake 706.33 ml  Output 600 ml  Net 106.33 ml   Filed Weights   11/21/18 1100 11/24/18 0520  Weight: 48.1 kg 49.7 kg    Examination: General exam: Appears comfortable  HEENT: PERRLA, oral mucosa moist, no sclera icterus or thrush Respiratory system: ;upper airway congestion. Mild RLL crackles. Respiratory effort normal. Cardiovascular system: S1 & S2 heard,  No murmurs  Gastrointestinal system: Abdomen soft, non-tender, nondistended. Normal bowel sound. No organomegaly Central nervous system: Alert and oriented. No focal neurological deficits. Extremities: No cyanosis, clubbing or edema Skin: No rashes or ulcers Psychiatry:  irritable and agitated- told me to go away   Data  Reviewed: I have personally reviewed following labs and imaging studies  CBC: Recent Labs  Lab 11/21/18 0036 11/22/18 0320 11/23/18 0755 11/24/18 0517  WBC 13.7* 13.1* 11.2* 12.3*  HGB 10.0* 10.2* 10.1* 9.6*  HCT 31.6* 31.3* 31.4* 30.8*  MCV 91.3 90.2 90.0 91.4  PLT 284 361 415* 443*   Basic Metabolic Panel: Recent Labs  Lab 11/21/18 0036 11/22/18 0943 11/22/18 1222 11/22/18 1637 11/23/18 0755 11/24/18 0517  NA 137 139  --  140 139 138  K 4.5 3.1*  --  3.5 4.1 4.8  CL 109 102  --  103 98 97*  CO2 17* 26  --  29 30 31   GLUCOSE 91 147*  --  131* 176* 146*  BUN 13 10  --  13 12 16   CREATININE 1.03* 0.85  --  0.83 1.00 1.05*  CALCIUM 8.7* 8.1*  --  8.8* 8.7* 8.8*  MG 1.9  --  1.7 1.7  --  1.5*   GFR: Estimated Creatinine Clearance: 34.1 mL/min (A) (by C-G formula based on SCr of 1.05 mg/dL (H)). Liver Function Tests: No results for input(s): AST, ALT, ALKPHOS, BILITOT, PROT, ALBUMIN in the last 168 hours. No results for input(s): LIPASE, AMYLASE in the last 168 hours. No results for input(s): AMMONIA in the last 168 hours. Coagulation Profile: No results for input(s): INR, PROTIME in the last 168 hours. Cardiac Enzymes: Recent Labs  Lab 11/21/18 0036 11/21/18 0659 11/23/18 1133 11/23/18 1605 11/23/18 2204  TROPONINI 0.26* 0.16* 0.11* 0.10* 0.12*   BNP (last 3 results) No results for input(s): PROBNP in the last 8760 hours. HbA1C: No results for input(s): HGBA1C in the last 72 hours. CBG: No results for input(s): GLUCAP in the last 168 hours. Lipid Profile: Recent Labs    11/23/18 0755  CHOL 159  HDL 39*  LDLCALC 106*  TRIG 70  CHOLHDL 4.1   Thyroid Function Tests: No results for input(s): TSH, T4TOTAL, FREET4, T3FREE, THYROIDAB in the last 72 hours. Anemia Panel: No results for input(s): VITAMINB12, FOLATE, FERRITIN, TIBC, IRON, RETICCTPCT in the last 72 hours. Urine analysis: No results found for: COLORURINE, APPEARANCEUR, LABSPEC, PHURINE,  GLUCOSEU, HGBUR, BILIRUBINUR, KETONESUR, PROTEINUR, UROBILINOGEN, NITRITE, LEUKOCYTESUR Sepsis Labs: @LABRCNTIP (procalcitonin:4,lacticidven:4) )No results found for this or any previous visit (from the past 240 hour(s)).       Radiology Studies: Dg Chest 2 View  Result Date: 11/23/2018 CLINICAL DATA:  Pt admitted a few days ago for PNA. C/o continued SOB, chest pains and productive cough. EXAM: CHEST - 2 VIEW COMPARISON:  11/21/2018 FINDINGS: LEFT-sided PICC line tip overlies the superior vena cava. Heart size is normal. There is persistent opacity of both lung bases, LEFT greater than RIGHT. The LEFT hemidiaphragm is completely obscured. LEFT pleural effusion. There is atherosclerotic calcification of the thoracic aorta. IMPRESSION: Persistent bibasilar opacities, LEFT greater than RIGHT. Electronically Signed   By: Nolon Nations M.D.   On: 11/23/2018 11:46      Scheduled Meds: . amLODipine  10 mg Oral Daily  . aspirin EC  81 mg Oral Daily  . atorvastatin  80 mg Oral Daily  . feeding supplement (ENSURE ENLIVE)  237 mL Oral BID BM  . furosemide  40 mg Intravenous Q8H  . gabapentin  100 mg Oral QHS  . guaiFENesin  600 mg Oral BID  . heparin  5,000 Units Subcutaneous Q8H  . lamoTRIgine  25 mg Oral TID  . losartan  50 mg Oral Daily  . multivitamin with minerals  1 tablet Oral Daily  . olopatadine  1 drop Both Eyes Daily  . potassium chloride  40 mEq Oral BID  . sodium chloride flush  10-40 mL Intracatheter Q12H  . sodium chloride flush  3 mL Intravenous Q12H  . sodium chloride flush  3 mL Intravenous Q12H   Continuous Infusions: . sodium chloride Stopped (11/23/18 1512)  . sodium chloride 50 mL/hr at 11/24/18 1146  . sodium chloride    . amiodarone 30 mg/hr (11/24/18 1358)  . ceFEPime (MAXIPIME) IV 1 g (11/24/18 1246)     LOS: 4 days    Time spent in minutes: [place], MD [facility] Pager: [email or web address] Password [facility] 11/24/2018, 3:47 PM

## 2018-11-24 NOTE — Progress Notes (Signed)
New EKG SR and LBBB, no acute changes from prior, though computer read as acute MI, no changes and pt just back from cath with non obstructive disease.  Discussed with RN, pt just wants to sleep.  No acute pain.

## 2018-11-24 NOTE — Progress Notes (Signed)
Site area: rt groin fa and fv sheaths Site Prior to Removal:  Level 0 Pressure Applied For: 20 minutes Manual:   yes Patient Status During Pull:  stable Post Pull Site:  Level  0 Post Pull Instructions Given:  yes Post Pull Pulses Present: rt dp palpable Dressing Applied:  Gauze and tegaderm Bedrest begins @ 1200 Comments:
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
## 2018-11-24 NOTE — Progress Notes (Signed)
Progress Note  Patient Name: Joy Dominguez Date of Encounter: 11/24/2018  Primary Cardiologist: Ena Dawley, MD   Subjective   Seen post cath. No chest pain. Femoral site c/d/i. She notes that she is feeling "hyper" and asking about something for her nerves.  Inpatient Medications    Scheduled Meds: . amLODipine  10 mg Oral Daily  . aspirin EC  81 mg Oral Daily  . atorvastatin  80 mg Oral Daily  . feeding supplement (ENSURE ENLIVE)  237 mL Oral BID BM  . furosemide  40 mg Intravenous Q8H  . gabapentin  100 mg Oral QHS  . guaiFENesin  600 mg Oral BID  . heparin  5,000 Units Subcutaneous Q8H  . lamoTRIgine  25 mg Oral TID  . losartan  50 mg Oral Daily  . multivitamin with minerals  1 tablet Oral Daily  . olopatadine  1 drop Both Eyes Daily  . potassium chloride  40 mEq Oral BID  . sodium chloride flush  10-40 mL Intracatheter Q12H  . sodium chloride flush  3 mL Intravenous Q12H  . sodium chloride flush  3 mL Intravenous Q12H   Continuous Infusions: . sodium chloride Stopped (11/23/18 1512)  . sodium chloride    . amiodarone 30 mg/hr (11/24/18 1358)  . magnesium sulfate 1 - 4 g bolus IVPB 4 g (11/24/18 1630)   PRN Meds: sodium chloride, sodium chloride, acetaminophen **OR** acetaminophen, albuterol, ibuprofen, sodium chloride flush, sodium chloride flush, sodium chloride flush, traZODone   Vital Signs    Vitals:   11/24/18 1227 11/24/18 1242 11/24/18 1257 11/24/18 1312  BP: (!) 148/63 139/61 (!) 139/52 128/65  Pulse: 80 74 76 75  Resp: (!) 22 19 20 19   Temp:      TempSrc:      SpO2: 97% 97% 98% 97%  Weight:      Height:        Intake/Output Summary (Last 24 hours) at 11/24/2018 1750 Last data filed at 11/24/2018 1631 Gross per 24 hour  Intake 709.33 ml  Output 600 ml  Net 109.33 ml   Filed Weights   11/21/18 1100 11/24/18 0520  Weight: 48.1 kg 49.7 kg    Telemetry    NSR- Personally Reviewed  ECG    SR, LBBB, similar to prior -  Personally Reviewed  Physical Exam   GEN: No acute distress.   Neck: No JVD Cardiac: RRR, no murmurs, rubs, or gallops.  Respiratory: coarse bilaterally, with decreased breath sounds in bilateral bases GI: Soft, nontender, non-distended  MS: No edema; No deformity. Neuro:  Nonfocal  Psych: Normal affect   Labs    Chemistry Recent Labs  Lab 11/22/18 1637 11/23/18 0755 11/24/18 0517  NA 140 139 138  K 3.5 4.1 4.8  CL 103 98 97*  CO2 29 30 31   GLUCOSE 131* 176* 146*  BUN 13 12 16   CREATININE 0.83 1.00 1.05*  CALCIUM 8.8* 8.7* 8.8*  GFRNONAA >60 54* 50*  GFRAA >60 >60 58*  ANIONGAP 8 11 10      Hematology Recent Labs  Lab 11/22/18 0320 11/23/18 0755 11/24/18 0517  WBC 13.1* 11.2* 12.3*  RBC 3.47* 3.49* 3.37*  HGB 10.2* 10.1* 9.6*  HCT 31.3* 31.4* 30.8*  MCV 90.2 90.0 91.4  MCH 29.4 28.9 28.5  MCHC 32.6 32.2 31.2  RDW 12.4 12.5 12.7  PLT 361 415* 429*    Cardiac Enzymes Recent Labs  Lab 11/21/18 0659 11/23/18 1133 11/23/18 1605 11/23/18 2204  TROPONINI  0.16* 0.11* 0.10* 0.12*   No results for input(s): TROPIPOC in the last 168 hours.   BNPNo results for input(s): BNP, PROBNP in the last 168 hours.   DDimer No results for input(s): DDIMER in the last 168 hours.   Radiology    Dg Chest 2 View  Result Date: 11/23/2018 CLINICAL DATA:  Pt admitted a few days ago for PNA. C/o continued SOB, chest pains and productive cough. EXAM: CHEST - 2 VIEW COMPARISON:  11/21/2018 FINDINGS: LEFT-sided PICC line tip overlies the superior vena cava. Heart size is normal. There is persistent opacity of both lung bases, LEFT greater than RIGHT. The LEFT hemidiaphragm is completely obscured. LEFT pleural effusion. There is atherosclerotic calcification of the thoracic aorta. IMPRESSION: Persistent bibasilar opacities, LEFT greater than RIGHT. Electronically Signed   By: Nolon Nations M.D.   On: 11/23/2018 11:46    Cardiac Studies   Echo 12/27 - Left ventricle: The  cavity size was normal. Systolic function was   vigorous. The estimated ejection fraction was in the range of 65%   to 70%. The study is not technically sufficient to allow   evaluation of LV diastolic function. Doppler parameters are   consistent with high ventricular filling pressure. Ratio of   mitral valve peak E velocity to medial annulus E (e&') velocity:   40.21. - Aortic valve: Mildly thickened leaflets. Mobility was not   restricted. Sclerosis without stenosis. Transvalvular velocity   was within the normal range. There was no stenosis. There was no   regurgitation. - Mitral valve: Calcified annulus. There was mild regurgitation.   Mean gradient (D): 5 mm Hg (HR 100 bpm). - Left atrium: The atrium was moderately dilated. - Right ventricle: The cavity size was mildly dilated. Wall   thickness was normal. Systolic function was normal. RV systolic   pressure (S, est): 46 mm Hg. - Right atrium: The atrium was mildly dilated. Central venous   pressure (est): 3 mm Hg. - Tricuspid valve: There was moderate regurgitation. - Pulmonary arteries: Systolic pressure was moderately increased. - Inferior vena cava: The vessel was normal in size. The   respirophasic diameter changes were in the normal range (>= 50%),   consistent with normal central venous pressure. - Pericardium, extracardiac: A small pericardial effusion was   identified anterior to the heart. The fluid exhibited a fibrinous   appearance. There was a left pleural effusion.  Cath 11/24/18 1. Mild to moderate, nonobstructive coronary artery disease. 2. Mildly elevated left heart, right heart, and pulmonary artery pressures. 3. Normal cardiac output.  Recommendations: 1. Medical therapy and risk factor modification to prevent progression of coronary artery disease. 2. Consider reinitiation of diuresis tomorrow to optimize volume status in the setting of acute on chronic diastolic heart failure. 3. Further management of  cardiac arrest with ventricular fibrillation per rounding team and electrophysiology.  Patient Profile     77 y.o. female [facility] for shortness of breath, sepsis, CAP, elevated troponin, acute respiratory failure2/2 COPD, CHF, &PNA and also had AKI on CKD. She was noted to have a prolonged QT and ultimately had cardiac arrest, VT versus V. fib, possible torsades.Sherequired CPR, got Mg 2 gm, epi x1,and defib x 1 w/ ROSC. Was intubated. Troponin post arrest was 0.69 and she was transferred to [facility] for further management.   Assessment & Plan    Cardiac Arrest/ NSTEMI:  - 2D echo with normal LVF EF 65-70% with increased LV filling pressure with E/e'  40. -cath today with nonobstructive CAD, mildly elevated filling pressures -monitor diuresis -Continue ASA 81mg  daily and Lipitor 80mg  daily   ? VT: none seen today. Has LBBB, has been in sinus rhythm.  - on amiodarone, consolidate to oral load tomorrow - Need to keep Mg >2.0 and K > 4.0.  -2D echo with normal LVEF -RV mildly dilated; could consider MRI for ARVC, though if she is a candidate for ICD may not change management  Prolonged QT: avoid QT prolonging agents.   PNA: -Still has significant wheezing and crackles at left base  management per IM.   HTN:  - BP better controlled today, most recent 128/65 - Continue amlodipine 10 mg daily - Continue losartan 50 mg daily  Acute diastolic CHF - I/O notes net negative 1 L, but weight actually increased to 49.7 kg from 48.1 kg on admission - Continue Lasix 40mg  IV TID. - follow I&Os and daily weight - creatinine stable  For questions or updates, please contact [facility] Please consult [email or web address] for contact info under     Signed, Buford Dresser, MD  11/24/2018, 5:50 PM

## 2018-11-24 NOTE — Care Management Important Message (Signed)
Important Message  Patient Details  Name: Joy Dominguez MRN: [id number] Date of Birth: 08/18/1941   Medicare Important Message Given:  Yes    Taha Dimond P [place] 11/24/2018, 3:30 PM

## 2018-11-24 NOTE — Progress Notes (Signed)
Pt stated that Nira Conn could be given information about her health when she called even though she did not have access code.  I was in patients room when I ask her if I could give her information.

## 2018-11-24 NOTE — Interval H&P Note (Signed)
History and Physical Interval Note:  11/24/2018 10:42 AM  Joy Dominguez  has presented today for cardiac catheterization, with the diagnosis of NSTEMI and VF. The various methods of treatment have been discussed with the patient and family. After consideration of risks, benefits and other options for treatment, the patient has consented to  Procedure(s): RIGHT/LEFT HEART CATH AND CORONARY ANGIOGRAPHY (N/A) as a surgical intervention .  The patient's history has been reviewed, patient examined, no change in status, stable for surgery.  I have reviewed the patient's chart and labs.  Questions were answered to the patient's satisfaction.    Cath Lab Visit (complete for each Cath Lab visit)  Clinical Evaluation Leading to the Procedure:   ACS: Yes.    Non-ACS:  N/A  Trinty Marken

## 2018-11-24 NOTE — Progress Notes (Signed)
Paged Dr. Paticia Stack for patients sleeping medication to be reordered. Patient takes Trazodone 50mg  at home.  Okay to reorder home medication.   Joy Dominguez

## 2018-11-25 ENCOUNTER — Inpatient Hospital Stay (HOSPITAL_COMMUNITY): Payer: Medicare Other

## 2018-11-25 LAB — CBC
HCT: 36.1 % (ref 36.0–46.0)
Hemoglobin: 11.5 g/dL — ABNORMAL LOW (ref 12.0–15.0)
MCH: 28.4 pg (ref 26.0–34.0)
MCHC: 31.9 g/dL (ref 30.0–36.0)
MCV: 89.1 fL (ref 80.0–100.0)
Platelets: 562 K/uL — ABNORMAL HIGH (ref 150–400)
RBC: 4.05 MIL/uL (ref 3.87–5.11)
RDW: 12.9 % (ref 11.5–15.5)
WBC: 12.5 K/uL — ABNORMAL HIGH (ref 4.0–10.5)
nRBC: 0 % (ref 0.0–0.2)

## 2018-11-25 MED ORDER — AMIODARONE HCL 200 MG PO TABS
400.0000 mg | ORAL_TABLET | Freq: Two times a day (BID) | ORAL | Status: DC
  Administered 2018-11-25 – 2018-11-28 (×6): 400 mg via ORAL
  Filled 2018-11-25 (×6): qty 2
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
## 2018-11-25 NOTE — Progress Notes (Signed)
Progress Note  Patient Name: Joy Dominguez Date of Encounter: 11/25/2018  Primary Cardiologist: Ena Dawley, MD   Subjective   Doing well overall. No chest pain or shortness of breath.  Inpatient Medications    Scheduled Meds: . amLODipine  10 mg Oral Daily  . aspirin EC  81 mg Oral Daily  . atorvastatin  80 mg Oral Daily  . feeding supplement (ENSURE ENLIVE)  237 mL Oral BID BM  . furosemide  40 mg Intravenous Q8H  . gabapentin  100 mg Oral QHS  . guaiFENesin  600 mg Oral BID  . heparin  5,000 Units Subcutaneous Q8H  . lamoTRIgine  25 mg Oral TID  . losartan  50 mg Oral Daily  . multivitamin with minerals  1 tablet Oral Daily  . olopatadine  1 drop Both Eyes Daily  . potassium chloride  40 mEq Oral BID  . sodium chloride flush  10-40 mL Intracatheter Q12H  . sodium chloride flush  3 mL Intravenous Q12H  . sodium chloride flush  3 mL Intravenous Q12H   Continuous Infusions: . sodium chloride Stopped (11/23/18 1512)  . sodium chloride    . amiodarone 30 mg/hr (11/25/18 1344)   PRN Meds: sodium chloride, sodium chloride, acetaminophen **OR** acetaminophen, albuterol, ibuprofen, sodium chloride flush, sodium chloride flush, sodium chloride flush, traZODone   Vital Signs    Vitals:   11/24/18 2055 11/24/18 2200 11/25/18 0528 11/25/18 1346  BP: (!) 137/49 (!) 154/60 (!) 152/54 (!) 160/66  Pulse: 76 74 74 78  Resp: 18 17 20    Temp: 97.6 F (36.4 C)  97.7 F (36.5 C) (!) 97.3 F (36.3 C)  TempSrc: Oral  Oral Axillary  SpO2: 92% 92% 96% 93%  Weight:      Height:        Intake/Output Summary (Last 24 hours) at 11/25/2018 1624 Last data filed at 11/25/2018 4008 Gross per 24 hour  Intake 1538.06 ml  Output 1751 ml  Net -212.94 ml   Filed Weights   11/21/18 1100 11/24/18 0520  Weight: 48.1 kg 49.7 kg    Telemetry    NSR, with brief episode of SVT- Personally Reviewed  ECG    No new since yesterday - Personally Reviewed  Physical Exam    GEN: No acute distress.   Neck: No JVD Cardiac: RRR, no murmurs, rubs, or gallops.  Respiratory: coarse bilaterally, with decreased breath sounds in bilateral bases GI: Soft, nontender, non-distended  MS: No edema; No deformity. Neuro:  Nonfocal  Psych: Normal affect   Labs    Chemistry Recent Labs  Lab 11/22/18 1637 11/23/18 0755 11/24/18 0517  NA 140 139 138  K 3.5 4.1 4.8  CL 103 98 97*  CO2 29 30 31   GLUCOSE 131* 176* 146*  BUN 13 12 16   CREATININE 0.83 1.00 1.05*  CALCIUM 8.8* 8.7* 8.8*  GFRNONAA >60 54* 50*  GFRAA >60 >60 58*  ANIONGAP 8 11 10      Hematology Recent Labs  Lab 11/23/18 0755 11/24/18 0517 11/25/18 0740  WBC 11.2* 12.3* 12.5*  RBC 3.49* 3.37* 4.05  HGB 10.1* 9.6* 11.5*  HCT 31.4* 30.8* 36.1  MCV 90.0 91.4 89.1  MCH 28.9 28.5 28.4  MCHC 32.2 31.2 31.9  RDW 12.5 12.7 12.9  PLT 415* 429* 562*    Cardiac Enzymes Recent Labs  Lab 11/21/18 0659 11/23/18 1133 11/23/18 1605 11/23/18 2204  TROPONINI 0.16* 0.11* 0.10* 0.12*   No results for input(s): TROPIPOC in  the last 168 hours.   BNPNo results for input(s): BNP, PROBNP in the last 168 hours.   DDimer No results for input(s): DDIMER in the last 168 hours.   Radiology    Dg Chest 2 View  Result Date: 11/25/2018 CLINICAL DATA:  Acute respiratory failure, edema, history of chronic bronchitis and CHF EXAM: CHEST - 2 VIEW COMPARISON:  Chest x-ray of 11/23/2018, 11/21/2018 and 11/20/2017 There is little change in left basilar opacity some of which is due to atelectasis. There are small to moderate-sized bilateral pleural effusions present left greater than right resulting in some basilar atelectasis. There is a mild degree of pulmonary vascular congestion which has improved in the interval. Mild cardiomegaly is stable. Left PICC line tip overlies the mid SVC. No acute bony abnormality is seen. IMPRESSION: 1. Persistent pleural effusions left-greater-than-right with resulting basilar  atelectasis. 2. Mild cardiomegaly and mild pulmonary vascular congestion with some improvement in the degree of pulmonary vascular congestion. Electronically Signed   By: Ivar Drape M.D.   On: 11/25/2018 09:01    Cardiac Studies   Echo 12/27 - Left ventricle: The cavity size was normal. Systolic function was   vigorous. The estimated ejection fraction was in the range of 65%   to 70%. The study is not technically sufficient to allow   evaluation of LV diastolic function. Doppler parameters are   consistent with high ventricular filling pressure. Ratio of   mitral valve peak E velocity to medial annulus E (e&') velocity:   40.21. - Aortic valve: Mildly thickened leaflets. Mobility was not   restricted. Sclerosis without stenosis. Transvalvular velocity   was within the normal range. There was no stenosis. There was no   regurgitation. - Mitral valve: Calcified annulus. There was mild regurgitation.   Mean gradient (D): 5 mm Hg (HR 100 bpm). - Left atrium: The atrium was moderately dilated. - Right ventricle: The cavity size was mildly dilated. Wall   thickness was normal. Systolic function was normal. RV systolic   pressure (S, est): 46 mm Hg. - Right atrium: The atrium was mildly dilated. Central venous   pressure (est): 3 mm Hg. - Tricuspid valve: There was moderate regurgitation. - Pulmonary arteries: Systolic pressure was moderately increased. - Inferior vena cava: The vessel was normal in size. The   respirophasic diameter changes were in the normal range (>= 50%),   consistent with normal central venous pressure. - Pericardium, extracardiac: A small pericardial effusion was   identified anterior to the heart. The fluid exhibited a fibrinous   appearance. There was a left pleural effusion.  Cath 11/24/18 1. Mild to moderate, nonobstructive coronary artery disease. 2. Mildly elevated left heart, right heart, and pulmonary artery pressures. 3. Normal cardiac  output.  Recommendations: 1. Medical therapy and risk factor modification to prevent progression of coronary artery disease. 2. Consider reinitiation of diuresis tomorrow to optimize volume status in the setting of acute on chronic diastolic heart failure. 3. Further management of cardiac arrest with ventricular fibrillation per rounding team and electrophysiology.  Patient Profile     77 y.o. female [facility] for shortness of breath, sepsis, CAP, elevated troponin, acute respiratory failure2/2 COPD, CHF, &PNA and also had AKI on CKD. She was noted to have a prolonged QT and ultimately had cardiac arrest, VT versus V. fib, possible torsades.Sherequired CPR, got Mg 2 gm, epi x1,and defib x 1 w/ ROSC. Was intubated. Troponin post arrest was 0.69 and she was transferred to  [facility] for further management.   Assessment & Plan    Cardiac Arrest/ NSTEMI:  - 2D echo with normal LVF EF 65-70% with increased LV filling pressure with E/e' 40. -cath with nonobstructive CAD, mildly elevated filling pressures -monitor diuresis -Continue ASA 81mg  daily and Lipitor 80mg  daily  -the trigger for her cardiac arrest is unclear to me. She did have sepsis, does have a borderline long QT now but also has a wide QRS. She has not had apparent sustained VT on monitors here since initiation of amiodarone. Concern for ICD placement for secondary prevention is risk of post-procedural complications if patient is reinfected or cannot follow movement restrictions for healing (see initial consult note per EMS description of living conditions).  History of VT vs. VF causing arrest: SVT on monitor this AM, but same morphology as her sinus beats, not consistent with VT. Has LBBB, has been in sinus rhythm.  - on amiodarone, consolidating to oral. 400 mg BID for a week, then 400 mg daily for a week, then 200 mg daily - Need to keep Mg >2.0 and K > 4.0.  -2D echo with normal  LVEF -nonobstructive CAD -follow TFTs, LFTs, PFTs as outpatient  Prolonged QT: avoid QT prolonging agents. Amiodarone is QT prolonging but does not induce torsades independently, but avoid other QT meds in conjunction.   PNA: -Continued decreased breath sounds in bilateral bases, management per primary team   HTN:  - BP fluctuates between good control and elevated. - Continue amlodipine 10 mg daily - Continue losartan 50 mg daily - consider adding beta blocker (given history of arrest) such as carvedilol if additional control needed.   Acute diastolic CHF - I/O and weights do not appear accurate, but overall appears similar to admission - Continue Lasix 40mg  IV TID. Monitor electrolytes closely. - follow I&Os and daily weight as best as able  For questions or updates, please contact [place] Please consult [email or web address] for contact info under     Signed, Buford Dresser, MD  11/25/2018, 4:24 PM

## 2018-11-25 NOTE — Progress Notes (Signed)
Physical Therapy Treatment Patient Details Name: Joy Dominguez MRN: [id number] DOB: 1941-03-22 Today's Date: 11/25/2018    History of Present Illness 77 y.o. female with medical history of CVA, dementia, HTN, HLD, A-fib, CHF, COPD, CKD 3, GERD admitted to [facility] center on 11/17/18 for acute respiratory failure due to CAP, septic shock requiring pressors. Also had A-fib with RVR which resolved. Noted to have minimally elevated troponin and cardiology suspected demand ischemia. On 11/19/18 she developed Torsades and a code blue was called. She was defibrillated, received compressions and 2 gm of Mg+ with ROSC. (She had a normal Mg+ level that morning.) She was intubated during the code and extubated 11/20/18. Cardiology recommended a cardiac cath for which she was transferred to [facility].     PT Comments    Pt admitted with above diagnosis. Pt currently with functional limitations due to balance and endurance deficits. Pt was adamant she was not getting OOB.  Pt would not show me that she can do more than come to EOB.  Pt also kept lying back on side due to dizziness per pt.  Given that pt would not get up, do not feel that this PT can change d/c plan.  Recommend SNF.   Pt will benefit from skilled PT to increase their independence and safety with mobility to allow discharge to the venue listed below.     Follow Up Recommendations  SNF     Equipment Recommendations  Other (comment)(TBD)    Recommendations for Other Services       Precautions / Restrictions Precautions Precautions: Fall Restrictions Weight Bearing Restrictions: No    Mobility  Bed Mobility Overal bed mobility: Needs Assistance Bed Mobility: Supine to Sit;Rolling;Sit to Supine Rolling: Supervision   Supine to sit: Supervision;HOB elevated Sit to supine: HOB elevated;Supervision   General bed mobility comments: +rail, no assist to elevate trunk but cuold never get fully upright saying she was  dizzy.  Pt refused adamantly to get OOB saying "noone feeds me" and throwing the lines at PT and saying, " get the "@$% out of here".  Tried every angle to get pt to stand and walk or get OOB and pt refused adamantly.  She states she can do it but will not demonstrate.    Transfers                 General transfer comment: Pt declining transfers/gait.  Ambulation/Gait             General Gait Details: Pt declining transfers/gait.   Stairs             Wheelchair Mobility    Modified Rankin (Stroke Patients Only)       Balance Overall balance assessment: Needs assistance Sitting-balance support: Feet supported;Single extremity supported Sitting balance-Leahy Scale: Fair Sitting balance - Comments: Pt sat EOB x 4 minutes min guard assist. Postural control: Left lateral lean                                  Cognition Arousal/Alertness: Awake/alert Behavior During Therapy: WFL for tasks assessed/performed Overall Cognitive Status: No family/caregiver present to determine baseline cognitive functioning                                 General Comments: mild confusion, unsure of accuracy of information provided by pt  Exercises      General Comments General comments (skin integrity, edema, etc.): VSS       Pertinent Vitals/Pain Pain Assessment: Faces Faces Pain Scale: Hurts little more Pain Location: generalized Pain Descriptors / Indicators: Sore;Grimacing;Discomfort Pain Intervention(s): Limited activity within patient's tolerance;Monitored during session;Repositioned    Home Living                      Prior Function            PT Goals (current goals can now be found in the care plan section) Acute Rehab PT Goals Patient Stated Goal: not stated Progress towards PT goals: Not progressing toward goals - comment(pt refusal to participate)    Frequency    Min 3X/week      PT Plan Current plan  remains appropriate    Co-evaluation              AM-PAC PT "6 Clicks" Mobility   Outcome Measure  Help needed turning from your back to your side while in a flat bed without using bedrails?: A Little Help needed moving from lying on your back to sitting on the side of a flat bed without using bedrails?: A Little Help needed moving to and from a bed to a chair (including a wheelchair)?: Total Help needed standing up from a chair using your arms (e.g., wheelchair or bedside chair)?: Total Help needed to walk in hospital room?: Total Help needed climbing 3-5 steps with a railing? : Total 6 Click Score: 10    End of Session Equipment Utilized During Treatment: Oxygen Activity Tolerance: Patient limited by fatigue(self limiting) Patient left: in bed;with call bell/phone within reach;with bed alarm set Nurse Communication: Mobility status(pts refusal to get up) PT Visit Diagnosis: Muscle weakness (generalized) (M62.81);Other abnormalities of gait and mobility (R26.89);Pain     Time: 1157-1210 PT Time Calculation (min) (ACUTE ONLY): 13 min  Charges:  $Therapeutic Activity: 8-22 mins                     Dona Ana Pager:  [phone]  Office:  [place] 11/25/2018, 12:30 PM

## 2018-11-25 NOTE — Progress Notes (Addendum)
PROGRESS NOTE    Joy Dominguez   BTD:[id number]  DOB: 12/11/1940  DOA: 11/20/2018 PCP: Welford Roche, NP   Brief Narrative:  Joy Dominguez  is a 77 y.o. female with medical history of CVA, dementia, HTN, HLD, A-fib, CHF, COPD, CKD 3, GERD admitted to [facility] center on 11/17/18 for acute respiratory failure due to CAP, septic shock requiring pressors. Also had A-fib with RVR which resolved. Noted to have minimally elevated troponin and cardiology suspected demand ischemia.  On 11/19/18 developed Torsades and a code blue was called. She was defibrillated, received compressions and 2 gm of Mg+ with ROSC. (she had a normal Mg+ and K+ level that morning). She was intubated during the code and extubated today. Cardiology recommended a cardiac cath for which she was transferred to [facility].    Subjective: No complaints today.   Assessment & Plan:   Principal Problem:   Cardiac arrest with ventricular fibrillation-  Torsades de pointes  minimally elevated troponin - ECHO at [facility] has poor images- showed normal EF and impaired diastolic relaxation (done prior to cardiac arrest)- repeat ECHO shows normal EF - see below - transferred here for Cath-  cardiology managing- cath results reveiwed- non obstructive disease  SVT vs V tach - per cardiology  Acute CHF, unspecifiec - crackles on exam, CXR suggestive of pulm edem-  -  ECHO noted below - normal EF- difficult to tell if diastolic dysfunction - diuresis per cardiology    Benign essential HTN - 12/27- resumed Norvasc and increased dose- added Hydralazine  - cardiology subsequently managing medications  Pneumonia -Septic shock H/o  COPD  -  - cont Albuterol Nebs - WBC count 18.3 on admission (in [place]) improved to 13.1 and then 12.3 - on Cefepime since 11/17/18 with a recommended stop date of 11/24/18 by the attending physician which I agree with- cough is minimal now with clear sputum -  will d/c  Cefepime today   Abnormal movement of chest wall on exam - checked rib films which show deformities due to prior fractures     AKI (acute kidney injury) - Cr 1.9 on admission to [facility]- improved to normal    HLD - cont statin  Anxiety/ depression - unfortunately in setting of prolonged QTc, home medications had to be held- home med list obtained from our pharmacy notes that she is on Celexa, Effexor, Trazodone and Lamictal-  The patient does not remember what she takes - Lamictal resumed-  -based on QTc, if OK with cardiology, can resume home meds - she feels like her "nerves " are acting up and RN is asking for something to calm her down but I am hesitant to give benzodiazepines to an elderly lady-   Delirium - has improved  GERD - on Protonix BID at home- on hold for prolonged QTc    DVT prophylaxis: Heparin s/c Code Status: Full code Family Communication:  Disposition Plan: follow on telemetry Consultants:   cardiology Procedures:   ECHO  CPR/ Intubation  Repeat ECHO Left ventricle: The cavity size was normal. Systolic function was   vigorous. The estimated ejection fraction was in the range of 65%   to 70%. The study is not technically sufficient to allow   evaluation of LV diastolic function. Doppler parameters are   consistent with high ventricular filling pressure. Ratio of   mitral valve peak E velocity to medial annulus E (e&') velocity:   40.21. - Aortic valve: Mildly thickened leaflets. Mobility  was not   restricted. Sclerosis without stenosis. Transvalvular velocity   was within the normal range. There was no stenosis. There was no   regurgitation. - Mitral valve: Calcified annulus. There was mild regurgitation.   Mean gradient (D): 5 mm Hg (HR 100 bpm). - Left atrium: The atrium was moderately dilated. - Right ventricle: The cavity size was mildly dilated. Wall   thickness was normal. Systolic function was normal. RV systolic   pressure (S, est):  46 mm Hg. - Right atrium: The atrium was mildly dilated. Central venous   pressure (est): 3 mm Hg. - Tricuspid valve: There was moderate regurgitation. - Pulmonary arteries: Systolic pressure was moderately increased. - Inferior vena cava: The vessel was normal in size. The   respirophasic diameter changes were in the normal range (>= 50%),   consistent with normal central venous pressure. - Pericardium, extracardiac: A small pericardial effusion was   identified anterior to the heart. The fluid exhibited a fibrinous   appearance. There was a left pleural effusion.  Antimicrobials:  Anti-infectives (From admission, onward)   Start     Dose/Rate Route Frequency Ordered Stop   11/21/18 1100  ceFEPIme (MAXIPIME) 1 g in sodium chloride 0.9 % 100 mL IVPB  Status:  Discontinued     1 g 200 mL/hr over 30 Minutes Intravenous Every 24 hours 11/20/18 1838 11/24/18 1549       Objective: Vitals:   11/24/18 2055 11/24/18 2200 11/25/18 0528 11/25/18 1346  BP: (!) 137/49 (!) 154/60 (!) 152/54 (!) 160/66  Pulse: 76 74 74 78  Resp: 18 17 20    Temp: 97.6 F (36.4 C)  97.7 F (36.5 C) (!) 97.3 F (36.3 C)  TempSrc: Oral  Oral Axillary  SpO2: 92% 92% 96% 93%  Weight:      Height:        Intake/Output Summary (Last 24 hours) at 11/25/2018 1659 Last data filed at 11/25/2018 0942 Gross per 24 hour  Intake 1415.06 ml  Output 1751 ml  Net -335.94 ml   Filed Weights   11/21/18 1100 11/24/18 0520  Weight: 48.1 kg 49.7 kg    Examination: General exam: Appears comfortable  HEENT: PERRLA, oral mucosa moist, no sclera icterus or thrush Respiratory system: ;upper airway congestion. Mild RLL crackles. Respiratory effort normal. Cardiovascular system: S1 & S2 heard,  No murmurs  Gastrointestinal system: Abdomen soft, non-tender, nondistended. Normal bowel sound. No organomegaly Central nervous system: Alert and oriented. No focal neurological deficits. Extremities: No cyanosis, clubbing or  edema Skin: No rashes or ulcers Psychiatry:  irritable and agitated- told me to go away   Data Reviewed: I have personally reviewed following labs and imaging studies  CBC: Recent Labs  Lab 11/21/18 0036 11/22/18 0320 11/23/18 0755 11/24/18 0517 11/25/18 0740  WBC 13.7* 13.1* 11.2* 12.3* 12.5*  HGB 10.0* 10.2* 10.1* 9.6* 11.5*  HCT 31.6* 31.3* 31.4* 30.8* 36.1  MCV 91.3 90.2 90.0 91.4 89.1  PLT 284 361 415* 429* 409*   Basic Metabolic Panel: Recent Labs  Lab 11/21/18 0036 11/22/18 0943 11/22/18 1222 11/22/18 1637 11/23/18 0755 11/24/18 0517  NA 137 139  --  140 139 138  K 4.5 3.1*  --  3.5 4.1 4.8  CL 109 102  --  103 98 97*  CO2 17* 26  --  29 30 31   GLUCOSE 91 147*  --  131* 176* 146*  BUN 13 10  --  13 12 16   CREATININE 1.03* 0.85  --  0.83 1.00 1.05*  CALCIUM 8.7* 8.1*  --  8.8* 8.7* 8.8*  MG 1.9  --  1.7 1.7  --  1.5*   GFR: Estimated Creatinine Clearance: 34.1 mL/min (A) (by C-G formula based on SCr of 1.05 mg/dL (H)). Liver Function Tests: No results for input(s): AST, ALT, ALKPHOS, BILITOT, PROT, ALBUMIN in the last 168 hours. No results for input(s): LIPASE, AMYLASE in the last 168 hours. No results for input(s): AMMONIA in the last 168 hours. Coagulation Profile: No results for input(s): INR, PROTIME in the last 168 hours. Cardiac Enzymes: Recent Labs  Lab 11/21/18 0036 11/21/18 0659 11/23/18 1133 11/23/18 1605 11/23/18 2204  TROPONINI 0.26* 0.16* 0.11* 0.10* 0.12*   BNP (last 3 results) No results for input(s): PROBNP in the last 8760 hours. HbA1C: No results for input(s): HGBA1C in the last 72 hours. CBG: No results for input(s): GLUCAP in the last 168 hours. Lipid Profile: Recent Labs    11/23/18 0755  CHOL 159  HDL 39*  LDLCALC 106*  TRIG 70  CHOLHDL 4.1   Thyroid Function Tests: No results for input(s): TSH, T4TOTAL, FREET4, T3FREE, THYROIDAB in the last 72 hours. Anemia Panel: No results for input(s): VITAMINB12, FOLATE,  FERRITIN, TIBC, IRON, RETICCTPCT in the last 72 hours. Urine analysis: No results found for: COLORURINE, APPEARANCEUR, LABSPEC, PHURINE, GLUCOSEU, HGBUR, BILIRUBINUR, KETONESUR, PROTEINUR, UROBILINOGEN, NITRITE, LEUKOCYTESUR Sepsis Labs: @LABRCNTIP (procalcitonin:4,lacticidven:4) )No results found for this or any previous visit (from the past 240 hour(s)).       Radiology Studies: Dg Chest 2 View  Result Date: 11/25/2018 CLINICAL DATA:  Acute respiratory failure, edema, history of chronic bronchitis and CHF EXAM: CHEST - 2 VIEW COMPARISON:  Chest x-ray of 11/23/2018, 11/21/2018 and 11/20/2017 There is little change in left basilar opacity some of which is due to atelectasis. There are small to moderate-sized bilateral pleural effusions present left greater than right resulting in some basilar atelectasis. There is a mild degree of pulmonary vascular congestion which has improved in the interval. Mild cardiomegaly is stable. Left PICC line tip overlies the mid SVC. No acute bony abnormality is seen. IMPRESSION: 1. Persistent pleural effusions left-greater-than-right with resulting basilar atelectasis. 2. Mild cardiomegaly and mild pulmonary vascular congestion with some improvement in the degree of pulmonary vascular congestion. Electronically Signed   By: Ivar Drape M.D.   On: 11/25/2018 09:01      Scheduled Meds: . amLODipine  10 mg Oral Daily  . aspirin EC  81 mg Oral Daily  . atorvastatin  80 mg Oral Daily  . feeding supplement (ENSURE ENLIVE)  237 mL Oral BID BM  . furosemide  40 mg Intravenous Q8H  . gabapentin  100 mg Oral QHS  . guaiFENesin  600 mg Oral BID  . heparin  5,000 Units Subcutaneous Q8H  . lamoTRIgine  25 mg Oral TID  . losartan  50 mg Oral Daily  . multivitamin with minerals  1 tablet Oral Daily  . olopatadine  1 drop Both Eyes Daily  . potassium chloride  40 mEq Oral BID  . sodium chloride flush  10-40 mL Intracatheter Q12H  . sodium chloride flush  3 mL  Intravenous Q12H  . sodium chloride flush  3 mL Intravenous Q12H   Continuous Infusions: . sodium chloride Stopped (11/23/18 1512)  . sodium chloride    . amiodarone 30 mg/hr (11/25/18 1344)     LOS: 5 days    Time spent in minutes: [place], MD [facility]  Pager: [email or web address] Password [email or web address] 11/25/2018, 4:59 PM

## 2018-11-25 NOTE — Social Work (Signed)
CSW met with patient, son, and daughter at bedside. Patient alert and oriented, sitting up in bedside chair. CSW discussed disposition plan. Patient continuing to refuse SNF. Patient's son lives with patient and stated he feels comfortable with bringing her home. He has been providing some support with care and ADLs even before patient's admission. They would like home health therapy. Referred to [facility] for home health needs. CSW will sign off.  Estanislado Emms, LCSW [phone]

## 2018-11-25 NOTE — Progress Notes (Signed)
Nutrition Follow-up  DOCUMENTATION CODES:   Non-severe (moderate) malnutrition in context of chronic illness  INTERVENTION:    Continue Ensure Enlive po BID, each supplement provides 350 kcal and 20 grams of protein  NUTRITION DIAGNOSIS:   Moderate Malnutrition related to chronic illness as evidenced by energy intake < 75% for > or equal to 3 months, mild fat depletion, mild muscle depletion.  Ongoing   GOAL:   Patient will meet greater than or equal to 90% of their needs  Progressing   MONITOR:   PO intake, Supplement acceptance, Weight trends, Labs   ASSESSMENT:   77 yo female, admitted for cardiac arrest with ventricular fibrillation. PMH significant for CVA, dementia, HTN, HLD, CHF, COPD, CKD 3, GERD. Admitted to [facility] on 12/23 for acute respiratory failure d/t CAP, septic shock requiring pressors. Transferred to [facility] on 12/25 for cardiac cath.  Patient is upset, saying she did not receive a breakfast meal tray today. Spoke with RN, who clarified the diet order. Patient has been drinking Ensure BID, she does not want to increase to TID.   Labs and medications reviewed.     Diet Order:   Diet Order            Diet Heart Room service appropriate? Yes; Fluid consistency: Thin  Diet effective now              EDUCATION NEEDS:   No education needs have been identified at this time  Skin:  Skin Assessment: Skin Integrity Issues: Skin Integrity Issues:: Other (Comment) Other: Ecchymosis BL hand, arm, hip  Last BM:  12/31  Height:   Ht Readings from Last 1 Encounters:  11/21/18 5\' 2"  (1.575 m)    Weight:   Wt Readings from Last 1 Encounters:  11/24/18 49.7 kg    Ideal Body Weight:  50 kg  BMI:  Body mass index is 20.04 kg/m.  Estimated Nutritional Needs:   Kcal:  1373 calories daily (MSJ x 1.5)  Protein:  60-75 gm daily (1.2-1.5 g/kg IBW)  Fluid:  >/= 1.3 L daily or per MD discretion    Molli Barrows, RD, LDN,  [place] Pager [phone] After Hours Pager [phone]

## 2018-11-26 DIAGNOSIS — I5031 Acute diastolic (congestive) heart failure: Secondary | ICD-10-CM

## 2018-11-26 LAB — BASIC METABOLIC PANEL
Anion gap: 6 (ref 5–15)
BUN: 22 mg/dL (ref 8–23)
CHLORIDE: 95 mmol/L — AB (ref 98–111)
CO2: 36 mmol/L — ABNORMAL HIGH (ref 22–32)
Calcium: 9 mg/dL (ref 8.9–10.3)
Creatinine, Ser: 1.19 mg/dL — ABNORMAL HIGH (ref 0.44–1.00)
GFR calc Af Amer: 50 mL/min — ABNORMAL LOW (ref >60)
GFR calc non Af Amer: 43 mL/min — ABNORMAL LOW (ref >60)
Glucose, Bld: 97 mg/dL (ref 70–99)
Potassium: 5.2 mmol/L — ABNORMAL HIGH (ref 3.5–5.1)
SODIUM: 137 mmol/L (ref 135–145)

## 2018-11-26 LAB — CBC
HCT: 31.4 % — ABNORMAL LOW (ref 36.0–46.0)
Hemoglobin: 9.9 g/dL — ABNORMAL LOW (ref 12.0–15.0)
MCH: 28.4 pg (ref 26.0–34.0)
MCHC: 31.5 g/dL (ref 30.0–36.0)
MCV: 90 fL (ref 80.0–100.0)
Platelets: 476 10*3/uL — ABNORMAL HIGH (ref 150–400)
RBC: 3.49 MIL/uL — ABNORMAL LOW (ref 3.87–5.11)
RDW: 13.1 % (ref 11.5–15.5)
WBC: 10.4 10*3/uL (ref 4.0–10.5)
nRBC: 0 % (ref 0.0–0.2)

## 2018-11-26 MED ORDER — METOPROLOL SUCCINATE ER 25 MG PO TB24
25.0000 mg | ORAL_TABLET | Freq: Every day | ORAL | Status: DC
  Administered 2018-11-26 – 2018-11-28 (×3): 25 mg via ORAL
  Filled 2018-11-26 (×3): qty 1

## 2018-11-26 MED ORDER — FUROSEMIDE 10 MG/ML IJ SOLN
40.0000 mg | Freq: Once | INTRAMUSCULAR | Status: AC
  Administered 2018-11-26: 40 mg via INTRAVENOUS
  Filled 2018-11-26: qty 4

## 2018-11-26 MED ORDER — GUAIFENESIN-DM 100-10 MG/5ML PO SYRP
5.0000 mL | ORAL_SOLUTION | ORAL | Status: DC | PRN
  Administered 2018-11-27 (×2): 5 mL via ORAL
  Filled 2018-11-26 (×2): qty 5

## 2018-11-26 MED ORDER — DICLOFENAC SODIUM 1 % TD GEL
2.0000 g | Freq: Four times a day (QID) | TRANSDERMAL | Status: DC
  Administered 2018-11-26 – 2018-11-28 (×10): 2 g via TOPICAL
  Filled 2018-11-26: qty 100

## 2018-11-26 MED ORDER — HYDROCOD POLST-CPM POLST ER 10-8 MG/5ML PO SUER
5.0000 mL | Freq: Two times a day (BID) | ORAL | Status: DC
  Administered 2018-11-26 – 2018-11-28 (×5): 5 mL via ORAL
  Filled 2018-11-26 (×5): qty 5

## 2018-11-26 NOTE — Progress Notes (Signed)
Progress Note  Patient Name: Joy Dominguez Date of Encounter: 11/26/2018  Primary Cardiologist: Ena Dawley, MD   Subjective   Deies CP   Wants to go home "IM paying for my house"  Inpatient Medications    Scheduled Meds: . amiodarone  400 mg Oral BID  . amLODipine  10 mg Oral Daily  . aspirin EC  81 mg Oral Daily  . atorvastatin  80 mg Oral Daily  . chlorpheniramine-HYDROcodone  5 mL Oral Q12H  . diclofenac sodium  2 g Topical QID  . feeding supplement (ENSURE ENLIVE)  237 mL Oral BID BM  . gabapentin  100 mg Oral QHS  . guaiFENesin  600 mg Oral BID  . heparin  5,000 Units Subcutaneous Q8H  . lamoTRIgine  25 mg Oral TID  . losartan  50 mg Oral Daily  . multivitamin with minerals  1 tablet Oral Daily  . olopatadine  1 drop Both Eyes Daily  . sodium chloride flush  10-40 mL Intracatheter Q12H  . sodium chloride flush  3 mL Intravenous Q12H  . sodium chloride flush  3 mL Intravenous Q12H   Continuous Infusions: . sodium chloride Stopped (11/23/18 1512)  . sodium chloride     PRN Meds: sodium chloride, sodium chloride, acetaminophen **OR** acetaminophen, albuterol, ibuprofen, sodium chloride flush, sodium chloride flush, sodium chloride flush, traZODone   Vital Signs    Vitals:   11/25/18 2017 11/26/18 0453 11/26/18 0747 11/26/18 0813  BP: (!) 136/55 (!) 138/48 (!) 139/52 (!) 148/47  Pulse: 78 74 68 74  Resp: 15 19 18  (!) 22  Temp: (!) 97.4 F (36.3 C) 97.8 F (36.6 C) 98 F (36.7 C)   TempSrc: Oral Axillary    SpO2: 93% 95% 97% 96%  Weight:  48.1 kg    Height:        Intake/Output Summary (Last 24 hours) at 11/26/2018 1019 Last data filed at 11/26/2018 0900 Gross per 24 hour  Intake 839.92 ml  Output 800 ml  Net 39.92 ml   Filed Weights   11/21/18 1100 11/24/18 0520 11/26/18 0453  Weight: 48.1 kg 49.7 kg 48.1 kg    Telemetry    SR Personally Reviewed  ECG    No new since yesterday - Personally Reviewed  Physical Exam   GEN: No  acute distress.   Neck: Neck is full Cardiac: RRR, no murmurs, rubs, or gallops.  Respiratory: coarse bilaterally,Rhonchi  Rales at bases   GI: Soft, nontender, non-distended  MS: T edema; No deformity. Neuro:  Nonfocal  Psych: Normal affect   Labs    Chemistry Recent Labs  Lab 11/23/18 0755 11/24/18 0517 11/26/18 0458  NA 139 138 137  K 4.1 4.8 5.2*  CL 98 97* 95*  CO2 30 31 36*  GLUCOSE 176* 146* 97  BUN 12 16 22   CREATININE 1.00 1.05* 1.19*  CALCIUM 8.7* 8.8* 9.0  GFRNONAA 54* 50* 43*  GFRAA >60 58* 50*  ANIONGAP 11 10 6      Hematology Recent Labs  Lab 11/24/18 0517 11/25/18 0740 11/26/18 0458  WBC 12.3* 12.5* 10.4  RBC 3.37* 4.05 3.49*  HGB 9.6* 11.5* 9.9*  HCT 30.8* 36.1 31.4*  MCV 91.4 89.1 90.0  MCH 28.5 28.4 28.4  MCHC 31.2 31.9 31.5  RDW 12.7 12.9 13.1  PLT 429* 562* 476*    Cardiac Enzymes Recent Labs  Lab 11/21/18 0659 11/23/18 1133 11/23/18 1605 11/23/18 2204  TROPONINI 0.16* 0.11* 0.10* 0.12*   No  results for input(s): TROPIPOC in the last 168 hours.   BNPNo results for input(s): BNP, PROBNP in the last 168 hours.   DDimer No results for input(s): DDIMER in the last 168 hours.   Radiology    Dg Chest 2 View  Result Date: 11/25/2018 CLINICAL DATA:  Acute respiratory failure, edema, history of chronic bronchitis and CHF EXAM: CHEST - 2 VIEW COMPARISON:  Chest x-ray of 11/23/2018, 11/21/2018 and 11/20/2017 There is little change in left basilar opacity some of which is due to atelectasis. There are small to moderate-sized bilateral pleural effusions present left greater than right resulting in some basilar atelectasis. There is a mild degree of pulmonary vascular congestion which has improved in the interval. Mild cardiomegaly is stable. Left PICC line tip overlies the mid SVC. No acute bony abnormality is seen. IMPRESSION: 1. Persistent pleural effusions left-greater-than-right with resulting basilar atelectasis. 2. Mild cardiomegaly and  mild pulmonary vascular congestion with some improvement in the degree of pulmonary vascular congestion. Electronically Signed   By: Ivar Drape M.D.   On: 11/25/2018 09:01    Cardiac Studies   Echo 12/27 - Left ventricle: The cavity size was normal. Systolic function was   vigorous. The estimated ejection fraction was in the range of 65%   to 70%. The study is not technically sufficient to allow   evaluation of LV diastolic function. Doppler parameters are   consistent with high ventricular filling pressure. Ratio of   mitral valve peak E velocity to medial annulus E (e&') velocity:   40.21. - Aortic valve: Mildly thickened leaflets. Mobility was not   restricted. Sclerosis without stenosis. Transvalvular velocity   was within the normal range. There was no stenosis. There was no   regurgitation. - Mitral valve: Calcified annulus. There was mild regurgitation.   Mean gradient (D): 5 mm Hg (HR 100 bpm). - Left atrium: The atrium was moderately dilated. - Right ventricle: The cavity size was mildly dilated. Wall   thickness was normal. Systolic function was normal. RV systolic   pressure (S, est): 46 mm Hg. - Right atrium: The atrium was mildly dilated. Central venous   pressure (est): 3 mm Hg. - Tricuspid valve: There was moderate regurgitation. - Pulmonary arteries: Systolic pressure was moderately increased. - Inferior vena cava: The vessel was normal in size. The   respirophasic diameter changes were in the normal range (>= 50%),   consistent with normal central venous pressure. - Pericardium, extracardiac: A small pericardial effusion was   identified anterior to the heart. The fluid exhibited a fibrinous   appearance. There was a left pleural effusion.  Cath 11/24/18 1. Mild to moderate, nonobstructive coronary artery disease. 2. Mildly elevated left heart, right heart, and pulmonary artery pressures. 3. Normal cardiac output.  Recommendations: 1. Medical therapy and  risk factor modification to prevent progression of coronary artery disease. 2. Consider reinitiation of diuresis tomorrow to optimize volume status in the setting of acute on chronic diastolic heart failure. 3. Further management of cardiac arrest with ventricular fibrillation per rounding team and electrophysiology.  Patient Profile     78 y.o. female [facility] for shortness of breath, sepsis, CAP, elevated troponin, acute respiratory failure2/2 COPD, CHF, &PNA and also had AKI on CKD. She was noted to have a prolonged QT and ultimately had cardiac arrest, VT versus V. fib, possible torsades.Sherequired CPR, got Mg 2 gm, epi x1,and defib x 1 w/ ROSC. Was intubated. Troponin post arrest was 0.69  and she was transferred to [facility] for further management.   Assessment & Plan    Cardiac Arrest/ NSTEMI:  - 2D echo with normal LVF EF 65-70% with increased LV filling pressure with E/e' 40. -cath with nonobstructive CAD, mildly elevated filling pressures -monitor diuresis -Continue ASA 81mg  daily and Lipitor 80mg  daily  -the trigger for her cardiac arrest is unclear . She did have sepsis, does have a borderline long QT now but also has a wide QRS. She has not had apparent sustained VT on monitors here since initiation of amiodarone. Plan 400 bid for 1 wk then 400 daily x 1 wk then 200 daily Would pursue medical Rx, not invasive given other medical/social issues   Avoid QT prolonging drugs   Will review with pharmacy antidepressants   Acute diastolic CHF   Pt still with volume increase on exam with neck fullness rales   Electrolytes, alkalosis noted   WOuld still give lasix iv x 1    Follow I/O  PNA: COntinued rhonchi  WOuld add incentive spiromety with resp assist    HTN:  - BP fluctuates between good control and elevated. - Continue amlodipine 10 mg daily - Continue losartan 50 mg daily Add low dose toprol    Pt needs rehab   She is very  deconditioned   For questions or updates, please contact [place] Please consult [email or web address] for contact info under     Signed, Dorris Carnes, MD  11/26/2018, 10:19 AM

## 2018-11-26 NOTE — Progress Notes (Addendum)
PROGRESS NOTE    Naw Lasala   URK:[id number]  DOB: Dec 01, 1940  DOA: 11/20/2018 PCP: Welford Roche, NP   Brief Narrative:  Kree Armato  is a 78 y.o. female with medical history of CVA, dementia, HTN, HLD, A-fib, CHF, COPD, CKD 3, GERD admitted to [facility] center on 11/17/18 for acute respiratory failure due to CAP, septic shock requiring pressors. Also had A-fib with RVR which resolved. Noted to have minimally elevated troponin and cardiology suspected demand ischemia.  On 11/19/18 developed Torsades and a code blue was called. She was defibrillated, received compressions and 2 gm of Mg+ with ROSC. (she had a normal Mg+ and K+ level that morning). She was intubated during the code and extubated today. Cardiology recommended a cardiac cath for which she was transferred to [facility].    Subjective: No complaints today.   Assessment & Plan:   Principal Problem:   Cardiac arrest with ventricular fibrillation-  Torsades de pointes  minimally elevated troponin - ECHO at [facility] has poor images- showed normal EF and impaired diastolic relaxation (done prior to cardiac arrest)- repeat ECHO shows normal EF - see below - transferred here for Cath-  cardiology managing- cath results reveiwed- non obstructive disease  SVT   -  per cardiology  Acute CHF, unspecified - crackles on exam, CXR suggestive of pulm edem-  -  ECHO noted below - normal EF- difficult to tell if diastolic dysfunction - diuresis per cardiology    Benign essential HTN - 12/27- resumed Norvasc and increased dose- added Hydralazine  - cardiology subsequently managing medications  Pneumonia -Septic shock H/o  COPD  -  - cont Albuterol Nebs - WBC count 18.3 on admission (in [place]) improved to 13.1 and then 12.3 - on Cefepime since 11/17/18 with a recommended stop date of 11/24/18 by the attending physician which I agree with- cough is minimal now with clear sputum - d/c'd   Cefepime  on 12/30- no longer has pneumonia symptoms- has cough with pale sputum likely indicative for underlying COPD- she is requesting Tussionex which she takes at home - I have added this   Mild hyperkalemia - hold K supplements  Abnormal movement of chest wall on exam - checked rib films which show deformities due to prior fractures     AKI (acute kidney injury) - Cr 1.9 on admission to [facility]- improved to normal    HLD - cont statin  Anxiety/ depression - unfortunately in setting of prolonged QTc (529 on EKG on 11/21/18) home medications had to be held- home med list obtained from our pharmacy notes that she is on Celexa, Effexor, Trazodone and Lamictal-  The patient does not remember what she takes - Lamictal and Trazodone resumed-  -based on QTc, if OK with cardiology, can resume other home psych meds - she feels like her "nerves " are acting up and RN is asking for something to calm her down but I am hesitant to give benzodiazepines to an elderly lady-   Delirium - has improved  GERD - on Protonix BID at home- on hold for prolonged QTc    DVT prophylaxis: Heparin s/c Code Status: Full code Family Communication:  Disposition Plan: follow on telemetry- declining SNF- would like to go home with [facility]- family willing to take her home and willing to assist her Consultants:   cardiology Procedures:   ECHO  CPR/ Intubation  Repeat ECHO Left ventricle: The cavity size was normal. Systolic function was  vigorous. The estimated ejection fraction was in the range of 65%   to 70%. The study is not technically sufficient to allow   evaluation of LV diastolic function. Doppler parameters are   consistent with high ventricular filling pressure. Ratio of   mitral valve peak E velocity to medial annulus E (e&') velocity:   40.21. - Aortic valve: Mildly thickened leaflets. Mobility was not   restricted. Sclerosis without stenosis. Transvalvular velocity   was within the normal range.  There was no stenosis. There was no   regurgitation. - Mitral valve: Calcified annulus. There was mild regurgitation.   Mean gradient (D): 5 mm Hg (HR 100 bpm). - Left atrium: The atrium was moderately dilated. - Right ventricle: The cavity size was mildly dilated. Wall   thickness was normal. Systolic function was normal. RV systolic   pressure (S, est): 46 mm Hg. - Right atrium: The atrium was mildly dilated. Central venous   pressure (est): 3 mm Hg. - Tricuspid valve: There was moderate regurgitation. - Pulmonary arteries: Systolic pressure was moderately increased. - Inferior vena cava: The vessel was normal in size. The   respirophasic diameter changes were in the normal range (>= 50%),   consistent with normal central venous pressure. - Pericardium, extracardiac: A small pericardial effusion was   identified anterior to the heart. The fluid exhibited a fibrinous   appearance. There was a left pleural effusion.  Antimicrobials:  Anti-infectives (From admission, onward)   Start     Dose/Rate Route Frequency Ordered Stop   11/21/18 1100  ceFEPIme (MAXIPIME) 1 g in sodium chloride 0.9 % 100 mL IVPB  Status:  Discontinued     1 g 200 mL/hr over 30 Minutes Intravenous Every 24 hours 11/20/18 1838 11/24/18 1549       Objective: Vitals:   11/26/18 0453 11/26/18 0747 11/26/18 0813 11/26/18 1220  BP: (!) 138/48 (!) 139/52 (!) 148/47 (!) 129/50  Pulse: 74 68 74 77  Resp: 19 18 (!) 22   Temp: 97.8 F (36.6 C) 98 F (36.7 C)    TempSrc: Axillary     SpO2: 95% 97% 96%   Weight: 48.1 kg     Height:        Intake/Output Summary (Last 24 hours) at 11/26/2018 1255 Last data filed at 11/26/2018 0900 Gross per 24 hour  Intake 739.92 ml  Output 800 ml  Net -60.08 ml   Filed Weights   11/21/18 1100 11/24/18 0520 11/26/18 0453  Weight: 48.1 kg 49.7 kg 48.1 kg    Examination: General exam: Appears comfortable  HEENT: PERRLA, oral mucosa moist, no sclera icterus or  thrush Respiratory system: ;upper airway congestion. Continues to have mild RLL crackles. Respiratory effort normal. Pulse ox 94-100 % on room air Cardiovascular system: S1 & S2 heard,  No murmurs  Gastrointestinal system: Abdomen soft, non-tender, nondistended. Normal bowel sound. No organomegaly Central nervous system: Alert and oriented. No focal neurological deficits. Extremities: No cyanosis, clubbing or edema Skin: No rashes or ulcers Psychiatry:  irritable and agitated- told me to go away   Data Reviewed: I have personally reviewed following labs and imaging studies  CBC: Recent Labs  Lab 11/22/18 0320 11/23/18 0755 11/24/18 0517 11/25/18 0740 11/26/18 0458  WBC 13.1* 11.2* 12.3* 12.5* 10.4  HGB 10.2* 10.1* 9.6* 11.5* 9.9*  HCT 31.3* 31.4* 30.8* 36.1 31.4*  MCV 90.2 90.0 91.4 89.1 90.0  PLT 361 415* 429* 562* 308*   Basic Metabolic Panel: Recent Labs  Lab 11/21/18  0036 11/22/18 0943 11/22/18 1222 11/22/18 1637 11/23/18 0755 11/24/18 0517 11/26/18 0458  NA 137 139  --  140 139 138 137  K 4.5 3.1*  --  3.5 4.1 4.8 5.2*  CL 109 102  --  103 98 97* 95*  CO2 17* 26  --  29 30 31  36*  GLUCOSE 91 147*  --  131* 176* 146* 97  BUN 13 10  --  13 12 16 22   CREATININE 1.03* 0.85  --  0.83 1.00 1.05* 1.19*  CALCIUM 8.7* 8.1*  --  8.8* 8.7* 8.8* 9.0  MG 1.9  --  1.7 1.7  --  1.5*  --    GFR: Estimated Creatinine Clearance: 29.1 mL/min (A) (by C-G formula based on SCr of 1.19 mg/dL (H)). Liver Function Tests: No results for input(s): AST, ALT, ALKPHOS, BILITOT, PROT, ALBUMIN in the last 168 hours. No results for input(s): LIPASE, AMYLASE in the last 168 hours. No results for input(s): AMMONIA in the last 168 hours. Coagulation Profile: No results for input(s): INR, PROTIME in the last 168 hours. Cardiac Enzymes: Recent Labs  Lab 11/21/18 0036 11/21/18 0659 11/23/18 1133 11/23/18 1605 11/23/18 2204  TROPONINI 0.26* 0.16* 0.11* 0.10* 0.12*   BNP (last 3  results) No results for input(s): PROBNP in the last 8760 hours. HbA1C: No results for input(s): HGBA1C in the last 72 hours. CBG: No results for input(s): GLUCAP in the last 168 hours. Lipid Profile: No results for input(s): CHOL, HDL, LDLCALC, TRIG, CHOLHDL, LDLDIRECT in the last 72 hours. Thyroid Function Tests: No results for input(s): TSH, T4TOTAL, FREET4, T3FREE, THYROIDAB in the last 72 hours. Anemia Panel: No results for input(s): VITAMINB12, FOLATE, FERRITIN, TIBC, IRON, RETICCTPCT in the last 72 hours. Urine analysis: No results found for: COLORURINE, APPEARANCEUR, LABSPEC, PHURINE, GLUCOSEU, HGBUR, BILIRUBINUR, KETONESUR, PROTEINUR, UROBILINOGEN, NITRITE, LEUKOCYTESUR Sepsis Labs: @LABRCNTIP (procalcitonin:4,lacticidven:4) )No results found for this or any previous visit (from the past 240 hour(s)).       Radiology Studies: Dg Chest 2 View  Result Date: 11/25/2018 CLINICAL DATA:  Acute respiratory failure, edema, history of chronic bronchitis and CHF EXAM: CHEST - 2 VIEW COMPARISON:  Chest x-ray of 11/23/2018, 11/21/2018 and 11/20/2017 There is little change in left basilar opacity some of which is due to atelectasis. There are small to moderate-sized bilateral pleural effusions present left greater than right resulting in some basilar atelectasis. There is a mild degree of pulmonary vascular congestion which has improved in the interval. Mild cardiomegaly is stable. Left PICC line tip overlies the mid SVC. No acute bony abnormality is seen. IMPRESSION: 1. Persistent pleural effusions left-greater-than-right with resulting basilar atelectasis. 2. Mild cardiomegaly and mild pulmonary vascular congestion with some improvement in the degree of pulmonary vascular congestion. Electronically Signed   By: Ivar Drape M.D.   On: 11/25/2018 09:01      Scheduled Meds: . amiodarone  400 mg Oral BID  . amLODipine  10 mg Oral Daily  . aspirin EC  81 mg Oral Daily  . atorvastatin  80  mg Oral Daily  . chlorpheniramine-HYDROcodone  5 mL Oral Q12H  . diclofenac sodium  2 g Topical QID  . feeding supplement (ENSURE ENLIVE)  237 mL Oral BID BM  . gabapentin  100 mg Oral QHS  . guaiFENesin  600 mg Oral BID  . heparin  5,000 Units Subcutaneous Q8H  . lamoTRIgine  25 mg Oral TID  . losartan  50 mg Oral Daily  . metoprolol succinate  25 mg Oral Daily  . multivitamin with minerals  1 tablet Oral Daily  . olopatadine  1 drop Both Eyes Daily  . sodium chloride flush  10-40 mL Intracatheter Q12H  . sodium chloride flush  3 mL Intravenous Q12H  . sodium chloride flush  3 mL Intravenous Q12H   Continuous Infusions: . sodium chloride Stopped (11/23/18 1512)  . sodium chloride       LOS: 6 days    Time spent in minutes: 35 discussed plan with Dr Harrington Challenger    Debbe Odea, MD [facility] Pager: [email or web address] Password [facility] 11/26/2018, 12:55 PM

## 2018-11-26 NOTE — Progress Notes (Signed)
Creatinine and K rising, developing alkalosis (likely contraction alkalosis). For now I will hold Lasix and K (being originally managed by cardiology) until cardiology can re-assess today. I would like cardiology to also address whether 1 of her psych meds (for her depression/anxiety) can be resumed based on current QTc.   Debbe Odea, MD

## 2018-11-26 NOTE — Progress Notes (Signed)
   11/26/18 0800  PT Visit Information  Last PT Received On 11/26/18   PT - Assessment/Plan  PT Frequency (ACUTE ONLY) Min 2X/week  Changed frequency to 2x week due to this is appropriate frequency for pts going to SNF per frequency guidelines.  Thanks.  Tyton Abdallah,PT Acute Rehabilitation Services Pager:  [phone]  Office:  [phone]    

## 2018-11-27 DIAGNOSIS — E440 Moderate protein-calorie malnutrition: Secondary | ICD-10-CM

## 2018-11-27 DIAGNOSIS — R0603 Acute respiratory distress: Secondary | ICD-10-CM

## 2018-11-27 LAB — BASIC METABOLIC PANEL
Anion gap: 9 (ref 5–15)
BUN: 25 mg/dL — ABNORMAL HIGH (ref 8–23)
CO2: 32 mmol/L (ref 22–32)
Calcium: 8.7 mg/dL — ABNORMAL LOW (ref 8.9–10.3)
Chloride: 96 mmol/L — ABNORMAL LOW (ref 98–111)
Creatinine, Ser: 1.38 mg/dL — ABNORMAL HIGH (ref 0.44–1.00)
GFR calc non Af Amer: 36 mL/min — ABNORMAL LOW (ref >60)
GFR, EST AFRICAN AMERICAN: 42 mL/min — AB (ref >60)
Glucose, Bld: 96 mg/dL (ref 70–99)
POTASSIUM: 4.8 mmol/L (ref 3.5–5.1)
Sodium: 137 mmol/L (ref 135–145)

## 2018-11-27 LAB — CBC
HCT: 32.1 % — ABNORMAL LOW (ref 36.0–46.0)
Hemoglobin: 9.7 g/dL — ABNORMAL LOW (ref 12.0–15.0)
MCH: 28.1 pg (ref 26.0–34.0)
MCHC: 30.2 g/dL (ref 30.0–36.0)
MCV: 93 fL (ref 80.0–100.0)
Platelets: 504 10*3/uL — ABNORMAL HIGH (ref 150–400)
RBC: 3.45 MIL/uL — ABNORMAL LOW (ref 3.87–5.11)
RDW: 13.2 % (ref 11.5–15.5)
WBC: 9.4 10*3/uL (ref 4.0–10.5)
nRBC: 0 % (ref 0.0–0.2)

## 2018-11-27 MED ORDER — ADULT MULTIVITAMIN W/MINERALS CH: 1.0000 | ORAL_TABLET | Freq: Every day | ORAL | Status: DC

## 2018-11-27 MED ORDER — ENSURE ENLIVE PO LIQD: 237.0000 mL | Freq: Two times a day (BID) | ORAL | 12 refills | Status: DC

## 2018-11-27 MED ORDER — DICLOFENAC SODIUM 1 % TD GEL: 2.0000 g | Freq: Four times a day (QID) | TRANSDERMAL | 0 refills | Status: DC

## 2018-11-27 MED ORDER — ATORVASTATIN CALCIUM 80 MG PO TABS: 80.0000 mg | ORAL_TABLET | Freq: Every day | ORAL | 0 refills | Status: AC

## 2018-11-27 MED ORDER — METOPROLOL SUCCINATE ER 25 MG PO TB24: 25.0000 mg | ORAL_TABLET | Freq: Every day | ORAL | 0 refills | Status: DC

## 2018-11-27 MED ORDER — POTASSIUM CHLORIDE CRYS ER 10 MEQ PO TBCR: EXTENDED_RELEASE_TABLET | ORAL | 0 refills | Status: DC

## 2018-11-27 MED ORDER — ASPIRIN 81 MG PO TBEC: 81.0000 mg | DELAYED_RELEASE_TABLET | Freq: Every day | ORAL | 0 refills | Status: DC

## 2018-11-27 MED ORDER — AMLODIPINE BESYLATE 10 MG PO TABS: 10.0000 mg | ORAL_TABLET | Freq: Every day | ORAL | 0 refills | Status: AC

## 2018-11-27 MED ORDER — AMIODARONE HCL 200 MG PO TABS: ORAL_TABLET | ORAL | 0 refills | Status: AC

## 2018-11-27 MED ORDER — FUROSEMIDE 40 MG PO TABS: ORAL_TABLET | ORAL | 0 refills | Status: DC

## 2018-11-27 MED ORDER — LOSARTAN POTASSIUM 50 MG PO TABS: 50.0000 mg | ORAL_TABLET | Freq: Every day | ORAL | 0 refills | Status: AC
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
## 2018-11-27 NOTE — Progress Notes (Signed)
Pulse ox rechecked by RN and found to be in 90s.

## 2018-11-27 NOTE — Discharge Summary (Addendum)
Physician Discharge Summary  Joy Dominguez WER:[id number] DOB: 05-31-1941 DOA: 11/20/2018  PCP: Welford Roche, NP  Admit date: 11/20/2018 Discharge date: 11/27/2018  Admitted From:home Disposition: Declining to go to SNF-    Recommendations for Outpatient Follow-up:  1. Bmet at next doctor's visit 2. Will need to hold all psych meds per cardiology for now- please follow up on this  Home Health:  ordered Discharge Condition:  stable CODE STATUS:  Full code  Diet recommendation:  Heart healthy Consultations:  Cardiology     Discharge Diagnoses:  Principal Problem:   Cardiac arrest with ventricular fibrillation   Active Problems:   CAP (community acquired pneumonia)   Septic shock   COPD (chronic obstructive pulmonary disease)     Demand ischemia (HCC)   AKI (acute kidney injury) ([place])   Atrial fibrillation ([employer])   Benign essential HTN   HLD (hyperlipidemia)   GERD (gastroesophageal reflux disease)   Malnutrition of moderate degree  Brief Summary: Joy Dominguez  is a 78 y.o.femalewho was initially admitted a [place] center with medical history ofCVA, dementia, HTN, HLD, A-fib, CHF, COPD, CKD 3, GERD admitted to [facility] center on 11/17/18 for acute respiratory failure due to CAP & septic shock requiring pressors. She also had A-fib with RVR which resolved. Noted to have minimally elevated troponin which cardiology suspected to be demand ischemia.  On 11/19/18 developed Torsades and a code blue was called. She was defibrillated, received compressions and 2 gm of Mg+ with ROSC. (she had a normal Mg+ and K+ level that morning). She was intubated during the code and extubated on 12/26. Cardiology recommended a cardiac cath for which she was transferred to [facility] on 12/26.  Hospital Course:  Principal Problem: Cardiac arrest with ventricular fibrillation-Torsades de pointes  - ECHO at [facility] had poor images but showed normal EF and  impaired diastolic relaxation (done prior to cardiac arrest)- repeat ECHO shows normal EF, mod pulm HTN, high left ventricular filling pressures, mild pericardial effusion - see below - transferredherefor Cath which was done on 12/30-  - cath results reveiwed- she has non obstructive disease - Amiodarone dosing as per cardiology> 400 mg BID for 5 more days, then 400 mg daily for 1 week, then 200 mg daily  SVT  noted during hospital stay -  per cardiology, on Amiodarone and Metoprolol  Acute CHF, unspecified - crackles on exam, CXR suggestive of pulm edem when she arrived to [facility]  -  ECHO noted below - normal EF- difficult to tell if diastolic dysfunction - underwent diuresis per cardiology- now recommended by cardiology to use 40 mg PRN Lasix for weight gain f 3 lb overnight or 5 lb in a week  Benign essential HTN - will be discharged with Amlodipine, Losartan and Metoprolol  Pneumonia-Septic shock H/oCOPD  - WBC count 18.3 on admission (in [place]) improved to 13.1 and then 12.3 - on Cefepime since 11/17/18 witha recommendedstop date of 12/30/19by the attending physician which I agree with- cough is minimal now with clear sputum - d/c'd  Cefepime on 12/30- no longer has pneumonia symptoms - no fevers, leukocytosis normalized -she states she has cough with pale sputum likely indicative for underlying COPD      Abnormal movement of chest wall on exam - checked rib films which show deformities due to prior fractures  AKI (acute kidney injury) - Cr 1.9 on admission to [facility]- improved but has risen again slightly to 1.3 (likely diuretics which she  will  be on PRN at home now)   HLD - cont statin  Anxiety/ depression - unfortunately in setting of prolonged QTc (529 on EKG on 11/21/18) home medications had to be held- home med list obtained from our pharmacy notes that she is on Celexa, Effexor, Trazodone  -  The patient does not remember what she  takes -based on QTc,  cardiology, is recommending to not resume her psych meds  Delirium - has improved- she has underlying dementia  GERD - on Protonix BID at home-this is also on hold for prolonged QTc- she's had no symptoms of GERD thus far in the hospital and is eating well   Consultants:   cardiology Procedures:   ECHO  CPR/ Intubation  Repeat ECHO Left ventricle: The cavity size was normal. Systolic function was vigorous. The estimated ejection fraction was in the range of 65% to 70%. The study is not technically sufficient to allow evaluation of LV diastolic function. Doppler parameters are consistent with high ventricular filling pressure. Ratio of mitral valve peak E velocity to medial annulus E (e&') velocity: 40.21. - Aortic valve: Mildly thickened leaflets. Mobility was not restricted. Sclerosis without stenosis. Transvalvular velocity was within the normal range. There was no stenosis. There was no regurgitation. - Mitral valve: Calcified annulus. There was mild regurgitation. Mean gradient (D): 5 mm Hg (HR 100 bpm). - Left atrium: The atrium was moderately dilated. - Right ventricle: The cavity size was mildly dilated. Wall thickness was normal. Systolic function was normal. RV systolic pressure (S, est): 46 mm Hg. - Right atrium: The atrium was mildly dilated. Central venous pressure (est): 3 mm Hg. - Tricuspid valve: There was moderate regurgitation. - Pulmonary arteries: Systolic pressure was moderately increased. - Inferior vena cava: The vessel was normal in size. The respirophasic diameter changes were in the normal range (>= 50%), consistent with normal central venous pressure. - Pericardium, extracardiac: A small pericardial effusion was identified anterior to the heart. The fluid exhibited a fibrinous appearance. There was a left pleural effusion.    Discharge Exam: Vitals:   11/27/18 0500 11/27/18 0957   BP: (!) 127/47 (!) 144/45  Pulse: 66 77  Resp: 16   Temp: (!) 97.3 F (36.3 C)   SpO2: 96%    Vitals:   11/26/18 1314 11/26/18 2026 11/27/18 0500 11/27/18 0957  BP: (!) 129/50 (!) 127/41 (!) 127/47 (!) 144/45  Pulse: 73 71 66 77  Resp: 16 16 16    Temp:  97.6 F (36.4 C) (!) 97.3 F (36.3 C)   TempSrc:  Oral Oral   SpO2: 93% 98% 96%   Weight:      Height:        General: Pt is alert, awake, not in acute distress Cardiovascular: RRR, S1/S2 +, no rubs, no gallops Respiratory: CTA bilaterally, no wheezing, no rhonchi Abdominal: Soft, NT, ND, bowel sounds + Extremities: no edema, no cyanosis   Discharge Instructions  Discharge Instructions    Diet - low sodium heart healthy   Complete by:  As directed    Increase activity slowly   Complete by:  As directed      Allergies as of 11/27/2018      Reactions   Penicillins Other (See Comments)   Patient unsure but was told she has allergy to penicillin, does not know reaction      Medication List    STOP taking these medications   carvedilol 6.25 MG tablet Commonly known as:  COREG  citalopram 20 MG tablet Commonly known as:  CELEXA   diphenhydrAMINE 25 mg capsule Commonly known as:  BENADRYL   lisinopril 20 MG tablet Commonly known as:  PRINIVIL,ZESTRIL   meloxicam 15 MG tablet Commonly known as:  MOBIC   pantoprazole 40 MG tablet Commonly known as:  PROTONIX   traZODone 50 MG tablet Commonly known as:  DESYREL   venlafaxine XR 37.5 MG 24 hr capsule Commonly known as:  EFFEXOR-XR     TAKE these medications   albuterol (2.5 MG/3ML) 0.083% nebulizer solution Commonly known as:  PROVENTIL Take 2.5 mg by nebulization every 6 (six) hours as needed for wheezing or shortness of breath.   amiodarone 200 MG tablet Commonly known as:  PACERONE 400 mg BID x 5 days, the 400 mg daily x 1 wk and then 200 mg daily   amLODipine 10 MG tablet Commonly known as:  NORVASC Take 1 tablet (10 mg total) by mouth  daily. Start taking on:  November 28, 2018 What changed:    medication strength  how much to take   aspirin 81 MG EC tablet Take 1 tablet (81 mg total) by mouth daily. Start taking on:  November 28, 2018   atorvastatin 80 MG tablet Commonly known as:  LIPITOR Take 1 tablet (80 mg total) by mouth daily. Start taking on:  November 28, 2018 What changed:    medication strength  how much to take   diclofenac sodium 1 % Gel Commonly known as:  VOLTAREN Apply 2 g topically 4 (four) times daily. Apply to sore, tender area on mid back   feeding supplement (ENSURE ENLIVE) Liqd Take 237 mLs by mouth 2 (two) times daily between meals.   furosemide 40 MG tablet Commonly known as:  LASIX Take only if you have a 3 lb weight gain overnight or a 5 lb weight gain in 1 wk- remember to take potassium with it What changed:    how much to take  how to take this  when to take this  additional instructions   gabapentin 100 MG capsule Commonly known as:  NEURONTIN Take 100 mg by mouth at bedtime.   guaiFENesin 600 MG 12 hr tablet Commonly known as:  MUCINEX Take by mouth 2 (two) times daily.   lamoTRIgine 25 MG tablet Commonly known as:  LAMICTAL Take 25 mg by mouth 3 (three) times daily.   losartan 50 MG tablet Commonly known as:  COZAAR Take 1 tablet (50 mg total) by mouth daily. Start taking on:  November 28, 2018   metoprolol succinate 25 MG 24 hr tablet Commonly known as:  TOPROL-XL Take 1 tablet (25 mg total) by mouth daily. Start taking on:  November 28, 2018   mometasone 50 MCG/ACT nasal spray Commonly known as:  NASONEX Place 2 sprays into the nose daily.   multivitamin with minerals Tabs tablet Take 1 tablet by mouth daily. Start taking on:  November 28, 2018   olopatadine 0.1 % ophthalmic solution Commonly known as:  PATANOL Place 1 drop into both eyes daily.   potassium chloride 10 MEQ tablet Commonly known as:  K-DUR,KLOR-CON Take only when taking  Furosemide. What changed:    how much to take  how to take this  when to take this  additional instructions      Follow-up Information    [facility].   Specialty:  Home Health Services Why:  Registered Nurse, Physical Therapy, Aide, Education officer, museum.  Contact information: [id number]  [place] [address] [phone]        Welford Roche, NP Follow up.   Specialty:  Gerontology Why:  as soon as possibly (by this coming Monday if possible)  Contact information: [address] [place]  [address] [phone]          Allergies  Allergen Reactions  . Penicillins Other (See Comments)    Patient unsure but was told she has allergy to penicillin, does not know reaction     Procedures/Studies:   ECHO @ [facility] hospital  CPR/ Intubation  Cardiac cath  Repeat ECHO Left ventricle: The cavity size was normal. Systolic function was vigorous. The estimated ejection fraction was in the range of 65% to 70%. The study is not technically sufficient to allow evaluation of LV diastolic function. Doppler parameters are consistent with high ventricular filling pressure. Ratio of mitral valve peak E velocity to medial annulus E (e&') velocity: 40.21. - Aortic valve: Mildly thickened leaflets. Mobility was not restricted. Sclerosis without stenosis. Transvalvular velocity was within the normal range. There was no stenosis. There was no regurgitation. - Mitral valve: Calcified annulus. There was mild regurgitation. Mean gradient (D): 5 mm Hg (HR 100 bpm). - Left atrium: The atrium was moderately dilated. - Right ventricle: The cavity size was mildly dilated. Wall thickness was normal. Systolic function was normal. RV systolic pressure (S, est): 46 mm Hg. - Right atrium: The atrium was mildly dilated. Central venous pressure (est): 3 mm Hg. - Tricuspid valve: There was moderate  regurgitation. - Pulmonary arteries: Systolic pressure was moderately increased. - Inferior vena cava: The vessel was normal in size. The respirophasic diameter changes were in the normal range (>= 50%), consistent with normal central venous pressure. - Pericardium, extracardiac: A small pericardial effusion was identified anterior to the heart. The fluid exhibited a fibrinous appearance. There was a left pleural effusion.   Dg Chest 2 View  Result Date: 11/25/2018 CLINICAL DATA:  Acute respiratory failure, edema, history of chronic bronchitis and CHF EXAM: CHEST - 2 VIEW COMPARISON:  Chest x-ray of 11/23/2018, 11/21/2018 and 11/20/2017 There is little change in left basilar opacity some of which is due to atelectasis. There are small to moderate-sized bilateral pleural effusions present left greater than right resulting in some basilar atelectasis. There is a mild degree of pulmonary vascular congestion which has improved in the interval. Mild cardiomegaly is stable. Left PICC line tip overlies the mid SVC. No acute bony abnormality is seen. IMPRESSION: 1. Persistent pleural effusions left-greater-than-right with resulting basilar atelectasis. 2. Mild cardiomegaly and mild pulmonary vascular congestion with some improvement in the degree of pulmonary vascular congestion. Electronically Signed   By: Ivar Drape M.D.   On: 11/25/2018 09:01   Dg Chest 2 View  Result Date: 11/23/2018 CLINICAL DATA:  Pt admitted a few days ago for PNA. C/o continued SOB, chest pains and productive cough. EXAM: CHEST - 2 VIEW COMPARISON:  11/21/2018 FINDINGS: LEFT-sided PICC line tip overlies the superior vena cava. Heart size is normal. There is persistent opacity of both lung bases, LEFT greater than RIGHT. The LEFT hemidiaphragm is completely obscured. LEFT pleural effusion. There is atherosclerotic calcification of the thoracic aorta. IMPRESSION: Persistent bibasilar opacities, LEFT greater than RIGHT.  Electronically Signed   By: Nolon Nations M.D.   On: 11/23/2018 11:46   Dg Chest 2 View  Result Date: 11/20/2018 CLINICAL DATA:  Status post CPR EXAM: CHEST - 2 VIEW COMPARISON:  None. FINDINGS: Cardiac  shadow is at the upper limits of normal in size. Aortic calcifications are seen. Mild central vascular congestion is noted as well as prominent pleural effusions left greater than right. No definitive acute bony abnormality is seen. Previously seen rib deformities are again noted and likely chronic. IMPRESSION: Vascular congestion and bilateral pleural effusions left greater than right. Electronically Signed   By: Inez Catalina M.D.   On: 11/20/2018 20:45   Dg Ribs Bilateral  Result Date: 11/20/2018 CLINICAL DATA:  Status post CPR, chest pain EXAM: BILATERAL RIBS - 3+ VIEW COMPARISON:  None. FINDINGS: Cardiac shadow is enlarged. Rib deformities are noted bilaterally involving the third sixth seventh and eighth ribs on the left and the fifth rib on the right. These are likely related to prior fractures. No acute displaced fracture is noted. No pneumothorax is seen. Bilateral pleural effusions left greater than right are noted with central vascular congestion. IMPRESSION: Multiple rib deformities likely chronic in nature. Vascular congestion with bilateral effusions left greater than right. No pneumothorax is seen. Electronically Signed   By: Inez Catalina M.D.   On: 11/20/2018 20:37   Dg Chest Port 1 View  Result Date: 11/21/2018 CLINICAL DATA:  Respiratory distress. EXAM: PORTABLE CHEST 1 VIEW COMPARISON:  11/20/2018 FINDINGS: A left PICC has been placed and terminates over the mid to lower SVC. The cardiac silhouette is borderline enlarged. Aortic atherosclerosis is again noted. There is persistent pulmonary vascular congestion with patchy perihilar and bibasilar lung opacities, left slightly worse than right and similar to the prior study. Small pleural effusions, left larger than right, are also  unchanged. No pneumothorax is identified. Bilateral rib fractures were more fully evaluated on yesterday's dedicated rib radiographs. IMPRESSION: 1. Left PICC terminates over the mid to lower SVC. 2. Unchanged pulmonary small pleural effusions, vascular congestion, and bilateral lung opacities which may reflect edema versus pneumonia. Electronically Signed   By: Logan Bores M.D.   On: 11/21/2018 17:56   [place] Ekg Site Rite  Result Date: 11/21/2018 If Site Rite image not attached, placement could not be confirmed due to current cardiac rhythm.    The results of significant diagnostics from this hospitalization (including imaging, microbiology, ancillary and laboratory) are listed below for reference.     Microbiology: No results found for this or any previous visit (from the past 240 hour(s)).   Labs: BNP (last 3 results) No results for input(s): BNP in the last 8760 hours. Basic Metabolic Panel: Recent Labs  Lab 11/21/18 0036  11/22/18 1222 11/22/18 1637 11/23/18 0755 11/24/18 0517 11/26/18 0458 11/27/18 0615  NA 137   < >  --  140 139 138 137 137  K 4.5   < >  --  3.5 4.1 4.8 5.2* 4.8  CL 109   < >  --  103 98 97* 95* 96*  CO2 17*   < >  --  29 30 31  36* 32  GLUCOSE 91   < >  --  131* 176* 146* 97 96  BUN 13   < >  --  13 12 16 22  25*  CREATININE 1.03*   < >  --  0.83 1.00 1.05* 1.19* 1.38*  CALCIUM 8.7*   < >  --  8.8* 8.7* 8.8* 9.0 8.7*  MG 1.9  --  1.7 1.7  --  1.5*  --   --    < > = values in this interval not displayed.   Liver Function Tests: No results for input(s): AST,  ALT, ALKPHOS, BILITOT, PROT, ALBUMIN in the last 168 hours. No results for input(s): LIPASE, AMYLASE in the last 168 hours. No results for input(s): AMMONIA in the last 168 hours. CBC: Recent Labs  Lab 11/23/18 0755 11/24/18 0517 11/25/18 0740 11/26/18 0458 11/27/18 0615  WBC 11.2* 12.3* 12.5* 10.4 9.4  HGB 10.1* 9.6* 11.5* 9.9* 9.7*  HCT 31.4* 30.8* 36.1 31.4* 32.1*  MCV 90.0 91.4 89.1 90.0  93.0  PLT 415* 429* 562* 476* 504*   Cardiac Enzymes: Recent Labs  Lab 11/21/18 0036 11/21/18 0659 11/23/18 1133 11/23/18 1605 11/23/18 2204  TROPONINI 0.26* 0.16* 0.11* 0.10* 0.12*   BNP: Invalid input(s): POCBNP CBG: No results for input(s): GLUCAP in the last 168 hours. D-Dimer No results for input(s): DDIMER in the last 72 hours. Hgb A1c No results for input(s): HGBA1C in the last 72 hours. Lipid Profile No results for input(s): CHOL, HDL, LDLCALC, TRIG, CHOLHDL, LDLDIRECT in the last 72 hours. Thyroid function studies No results for input(s): TSH, T4TOTAL, T3FREE, THYROIDAB in the last 72 hours.  Invalid input(s): FREET3 Anemia work up No results for input(s): VITAMINB12, FOLATE, FERRITIN, TIBC, IRON, RETICCTPCT in the last 72 hours. Urinalysis No results found for: COLORURINE, APPEARANCEUR, LABSPEC, [place], GLUCOSEU, HGBUR, BILIRUBINUR, KETONESUR, PROTEINUR, UROBILINOGEN, NITRITE, LEUKOCYTESUR Sepsis Labs Invalid input(s): PROCALCITONIN,  WBC,  LACTICIDVEN Microbiology No results found for this or any previous visit (from the past 240 hour(s)).   Time coordinating discharge in minutes: 65  SIGNED:   Debbe Odea, MD  [facility] 11/27/2018, 11:49 AM Pager   If 7PM-7AM, please contact night-coverage [email or web address] Password [email or web address]

## 2018-11-27 NOTE — Progress Notes (Signed)
SATURATION QUALIFICATIONS: (This note is used to comply with regulatory documentation for home oxygen)  Patient Saturations on Room Air at Rest = 83%  Patient Saturations on Room Air while Ambulating = N/A, pt qualified on RA, unable to ambulate  Patient Saturations on 2 Liters of oxygen while sitting in bed = 94%  Please briefly explain why patient needs home oxygen:

## 2018-11-27 NOTE — Progress Notes (Signed)
Progress Note  Patient Name: Joy Dominguez Date of Encounter: 11/27/2018  Primary Cardiologist: Ena Dawley, MD   Subjective   Wants to go home. Complaining of chest pain when she coughs or pushes on her chest.   Inpatient Medications    Scheduled Meds: . amiodarone  400 mg Oral BID  . amLODipine  10 mg Oral Daily  . aspirin EC  81 mg Oral Daily  . atorvastatin  80 mg Oral Daily  . chlorpheniramine-HYDROcodone  5 mL Oral Q12H  . diclofenac sodium  2 g Topical QID  . feeding supplement (ENSURE ENLIVE)  237 mL Oral BID BM  . gabapentin  100 mg Oral QHS  . guaiFENesin  600 mg Oral BID  . heparin  5,000 Units Subcutaneous Q8H  . lamoTRIgine  25 mg Oral TID  . losartan  50 mg Oral Daily  . metoprolol succinate  25 mg Oral Daily  . multivitamin with minerals  1 tablet Oral Daily  . olopatadine  1 drop Both Eyes Daily  . sodium chloride flush  10-40 mL Intracatheter Q12H  . sodium chloride flush  3 mL Intravenous Q12H  . sodium chloride flush  3 mL Intravenous Q12H   Continuous Infusions: . sodium chloride Stopped (11/23/18 1512)  . sodium chloride     PRN Meds: sodium chloride, sodium chloride, acetaminophen **OR** acetaminophen, albuterol, guaiFENesin-dextromethorphan, ibuprofen, sodium chloride flush, sodium chloride flush, sodium chloride flush, traZODone   Vital Signs    Vitals:   11/26/18 1314 11/26/18 2026 11/27/18 0500 11/27/18 0957  BP: (!) 129/50 (!) 127/41 (!) 127/47 (!) 144/45  Pulse: 73 71 66 77  Resp: 16 16 16    Temp:  97.6 F (36.4 C) (!) 97.3 F (36.3 C)   TempSrc:  Oral Oral   SpO2: 93% 98% 96%   Weight:      Height:        Intake/Output Summary (Last 24 hours) at 11/27/2018 1101 Last data filed at 11/26/2018 1800 Gross per 24 hour  Intake 220 ml  Output 600 ml  Net -380 ml   Filed Weights   11/21/18 1100 11/24/18 0520 11/26/18 0453  Weight: 48.1 kg 49.7 kg 48.1 kg    Telemetry    NSR- Personally Reviewed  ECG    NSR,  LBBB, QTc by manual calculation 460 msec - Personally Reviewed  Physical Exam   GEN: No acute distress.   Neck: JVD flat at 45 degrees Cardiac: RRR, no murmurs, rubs, or gallops.  Respiratory: coarse bilaterally, with decreased breath sounds in bilateral bases GI: Soft, nontender, non-distended  MS: No edema; No deformity. Neuro:  Nonfocal  Psych: Normal affect   Labs    Chemistry Recent Labs  Lab 11/24/18 0517 11/26/18 0458 11/27/18 0615  NA 138 137 137  K 4.8 5.2* 4.8  CL 97* 95* 96*  CO2 31 36* 32  GLUCOSE 146* 97 96  BUN 16 22 25*  CREATININE 1.05* 1.19* 1.38*  CALCIUM 8.8* 9.0 8.7*  GFRNONAA 50* 43* 36*  GFRAA 58* 50* 42*  ANIONGAP 10 6 9      Hematology Recent Labs  Lab 11/25/18 0740 11/26/18 0458 11/27/18 0615  WBC 12.5* 10.4 9.4  RBC 4.05 3.49* 3.45*  HGB 11.5* 9.9* 9.7*  HCT 36.1 31.4* 32.1*  MCV 89.1 90.0 93.0  MCH 28.4 28.4 28.1  MCHC 31.9 31.5 30.2  RDW 12.9 13.1 13.2  PLT 562* 476* 504*    Cardiac Enzymes Recent Labs  Lab 11/21/18  2706 11/23/18 1133 11/23/18 1605 11/23/18 2204  TROPONINI 0.16* 0.11* 0.10* 0.12*   No results for input(s): TROPIPOC in the last 168 hours.   BNPNo results for input(s): BNP, PROBNP in the last 168 hours.   DDimer No results for input(s): DDIMER in the last 168 hours.   Radiology    No results found.  Cardiac Studies   Echo 12/27 - Left ventricle: The cavity size was normal. Systolic function was   vigorous. The estimated ejection fraction was in the range of 65%   to 70%. The study is not technically sufficient to allow   evaluation of LV diastolic function. Doppler parameters are   consistent with high ventricular filling pressure. Ratio of   mitral valve peak E velocity to medial annulus E (e&') velocity:   40.21. - Aortic valve: Mildly thickened leaflets. Mobility was not   restricted. Sclerosis without stenosis. Transvalvular velocity   was within the normal range. There was no stenosis.  There was no   regurgitation. - Mitral valve: Calcified annulus. There was mild regurgitation.   Mean gradient (D): 5 mm Hg (HR 100 bpm). - Left atrium: The atrium was moderately dilated. - Right ventricle: The cavity size was mildly dilated. Wall   thickness was normal. Systolic function was normal. RV systolic   pressure (S, est): 46 mm Hg. - Right atrium: The atrium was mildly dilated. Central venous   pressure (est): 3 mm Hg. - Tricuspid valve: There was moderate regurgitation. - Pulmonary arteries: Systolic pressure was moderately increased. - Inferior vena cava: The vessel was normal in size. The   respirophasic diameter changes were in the normal range (>= 50%),   consistent with normal central venous pressure. - Pericardium, extracardiac: A small pericardial effusion was   identified anterior to the heart. The fluid exhibited a fibrinous   appearance. There was a left pleural effusion.  Cath 11/24/18 1. Mild to moderate, nonobstructive coronary artery disease. 2. Mildly elevated left heart, right heart, and pulmonary artery pressures. 3. Normal cardiac output.  Recommendations: 1. Medical therapy and risk factor modification to prevent progression of coronary artery disease. 2. Consider reinitiation of diuresis tomorrow to optimize volume status in the setting of acute on chronic diastolic heart failure. 3. Further management of cardiac arrest with ventricular fibrillation per rounding team and electrophysiology.  Patient Profile     78 y.o. female [facility] for shortness of breath, sepsis, CAP, elevated troponin, acute respiratory failure2/2 COPD, CHF, &PNA and also had AKI on CKD. She was noted to have a prolonged QT and ultimately had cardiac arrest, VT versus V. fib, possible torsades.Sherequired CPR, got Mg 2 gm, epi x1,and defib x 1 w/ ROSC. Was intubated. Troponin post arrest was 0.69 and she was transferred to [facility] for  further management.   Assessment & Plan    Cardiac Arrest/ NSTEMI:  - 2D echo with normal LVF EF 65-70% with increased LV filling pressure with E/e' 40. -cath with nonobstructive CAD, mildly elevated filling pressures -monitor diuresis -Continue ASA 81mg  daily and Lipitor 80mg  daily  -no plans for ICD given concomitant medical and social issues - on amiodarone, consolidating to oral. 400 mg BID for a week, then 400 mg daily for a week, then 200 mg daily - keep Mg >2.0 and K > 4.0.  -follow TFTs, LFTs, PFTs as outpatient  Prolonged QT: avoid QT prolonging agents. Amiodarone is QT prolonging but does not induce torsades independently, but avoid other QT meds  in conjunction. She has been off of antidepressants since admission. Would not continue trazodone as an outpatient as she will be unmonitored. Would avoid any QT prolonging agents.  PNA: -Continued decreased breath sounds in bilateral bases, management per primary team   HTN:  - Continue amlodipine 10 mg daily - Continue losartan 50 mg daily - continue metoprolol  Acute diastolic CHF - cr up slightly today, JVD flat - would benefit from PRN weight based lasix dosing at home  [place] will sign off in anticipation of discharge.   Medication Recommendations:  As noted: Amiodarone 400 mg BID for 5 more days, then 400 mg daily for 1 week, then 200 mg daily Amlodipine 10 mg daily Aspirin 81 mg daily Atorvastatin 80 mg daily Losartan 50 mg daily Metoprolol succinate 25 mg daily Furosemide 40 mg PRN daily weight gain 3 lbs overnight or 5 lbs in a week.  Other recommendations (labs, testing, etc):  bmet 1 week Follow up as an outpatient:  Wishes to follow up in [place], we will arrange.  For questions or updates, please contact [place] Please consult [email or web address] for contact info under     Signed, Buford Dresser, MD  11/27/2018, 11:01 AM

## 2018-11-27 NOTE — Care Management Important Message (Signed)
Important Message  Patient Details  Name: Joy Dominguez MRN: [id number] Date of Birth: July 24, 1941   Medicare Important Message Given:  Yes    Azizah Lisle P January Bergthold 11/27/2018, 4:16 PM

## 2018-11-27 NOTE — Discharge Instructions (Signed)
Many of your medications have been stopped or changed due to the cardiac arrest. Please see your family doctor as soon as possible to see if some of these medications need to be replaced by others (specifically your psychiatric medications).  The cardiologists will be setting up a follow up appointment with you.  Make sure to weigh yourself daily as the Lasix will now be only taken if your are gaining weight (per your cardiologists)  You were cared for by a hospitalist during your hospital stay. If you have any questions about your discharge medications or the care you received while you were in the hospital after you are discharged, you can call the unit and asked to speak with the hospitalist on call if the hospitalist that took care of you is not available. Once you are discharged, your primary care physician will handle any further medical issues.   Please note that NO REFILLS for any discharge medications will be authorized once you are discharged, as it is imperative that you return to your primary care physician (or establish a relationship with a primary care physician if you do not have one) for your aftercare needs so that they can reassess your need for medications and monitor your lab values.  Please take all your medications with you for your next visit with your Primary MD. Please ask your Primary MD to get all Hospital records sent to his/her office. Please request your Primary MD to go over all hospital test results at the follow up.   If you experience worsening of your admission symptoms, develop shortness of breath, chest pain, suicidal or homicidal thoughts or a life threatening emergency, you must seek medical attention immediately by calling 911 or calling your MD.   Dennis Bast must read the complete instructions/literature along with all the possible adverse reactions/side effects for all the medicines you take including new medications that have been prescribed to you. Take new  medicines after you have completely understood and accpet all the possible adverse reactions/side effects.    Do not drive when taking pain medications or sedatives.     Do not take more than prescribed Pain, Sleep and Anxiety Medications   If you have smoked or chewed Tobacco in the last 2 yrs please stop. Stop any regular alcohol  and or recreational drug use.   Wear Seat belts while driving.

## 2018-11-28 MED ORDER — FUROSEMIDE 40 MG PO TABS: ORAL_TABLET | ORAL | 0 refills | Status: AC

## 2018-11-28 MED ORDER — POTASSIUM CHLORIDE CRYS ER 10 MEQ PO TBCR: EXTENDED_RELEASE_TABLET | ORAL | 0 refills | Status: AC

## 2018-11-28 MED ORDER — ADULT MULTIVITAMIN W/MINERALS CH: 1.0000 | ORAL_TABLET | Freq: Every day | ORAL | 11 refills | Status: AC

## 2018-11-28 MED ORDER — ASPIRIN 81 MG PO TBEC: 81.0000 mg | DELAYED_RELEASE_TABLET | Freq: Every day | ORAL | 0 refills | Status: AC

## 2018-11-28 MED ORDER — METOPROLOL SUCCINATE ER 25 MG PO TB24: 25.0000 mg | ORAL_TABLET | Freq: Every day | ORAL | 0 refills | Status: AC

## 2018-11-28 MED ORDER — ENSURE ENLIVE PO LIQD: 237.0000 mL | Freq: Two times a day (BID) | ORAL | 12 refills | Status: AC

## 2018-11-28 MED FILL — LOSARTAN POTASSIUM 50 MG TA: 50 | 30 days supply | Qty: 30 | Fill #0

## 2018-11-28 MED FILL — ASPIRIN LOW DOSE 81 MG TBEC: 81 | 30 days supply | Qty: 30 | Fill #0

## 2018-11-28 MED FILL — CERTAVITE-ANTIOXIDANT TAB: 30 days supply | Qty: 30 | Fill #0

## 2018-11-28 MED FILL — DICLOFENAC SODIUM 1% GEL: 1 | 13 days supply | Qty: 100 | Fill #0

## 2018-11-28 MED FILL — POTASSIUM CHL ER M10 TABLET: 10 | 30 days supply | Qty: 30 | Fill #0

## 2018-11-28 MED FILL — FUROSEMIDE 40 MG TABLET: 40 | 30 days supply | Qty: 30 | Fill #0

## 2018-11-28 MED FILL — AMIODARONE HCL 200 MG TAB: 200 | 30 days supply | Qty: 60 | Fill #0

## 2018-11-28 MED FILL — AMLODIPINE BESYLATE 10 MG T: 10 | 30 days supply | Qty: 30 | Fill #0

## 2018-11-28 MED FILL — ATORVASTATIN CALCIUM 80 MG: 80 | 30 days supply | Qty: 30 | Fill #0

## 2018-11-28 MED FILL — METOPROLOL SUCCINATE ER 25: 25 | 30 days supply | Qty: 30 | Fill #0

## 2018-11-28 NOTE — Progress Notes (Signed)
Patient discharged. Daughter, Joy Dominguez, picked up patient. Patient received medication from [facility]. Explained discharge information. Spoke to son about two medications that have been stopped, Celexa and Effexor. Verbalized agreeing. Patient stated she understood discharge information and new meds.

## 2018-11-28 NOTE — Progress Notes (Signed)
Patient's daughter came and agreed to take patient home with her today.  They just left.  No changes to DC summary from yesterday.  Asked the RN to talk w/ pt's son about withholding the Celexa and Effexor-XR for now until she sees her cardiologist in follow up, this because of concern for QT prolongation as etiology of her cardiac arrest.    Kelly Splinter MD [facility] pgr [phone] 11/28/2018, 12:49 PM

## 2018-11-28 NOTE — Progress Notes (Deleted)
SATURATION QUALIFICATIONS: (This note is used to comply with regulatory documentation for home oxygen)  Patient Saturations on Room Air at Rest = 87%  Patient Saturations on Room Air while Ambulating = 89-90%  Patient Saturations on 2 Liters of oxygen while Ambulating = 90%  Please briefly explain why patient needs home oxygen: Needs oxygen per 87% on room air. Case manager aware.

## 2018-11-28 NOTE — Progress Notes (Signed)
PT Cancellation Note  Patient Details Name: Joy Dominguez MRN: [id number] DOB: Nov 16, 1941   Cancelled Treatment:    Reason Eval/Treat Not Completed: Other (comment). Pt continues to decline OOB mobility. Refusing PT treatment and does not want to stay on caseload. Reports he owns wheelchair and all necessary DME (was not willing to provide more detail). Continue to recommend SNF-level therapies (if pt agreeable to participate) as pt has mobilized very little since admission, but pt apparently refusing.   Acute PT will sign off. Please reconsult if new needs arise.  Mabeline Caras, PT, DPT Acute Rehabilitation Services  Pager [phone] Office [place] 11/28/2018, 10:34 AM

## 2018-11-28 NOTE — Social Work (Signed)
CSW did speak to patient's daughter, Jacqlyn Larsen and son, Minerva Fester, on the phone this morning. They planned to coordinate with each other to plan who would pick up patient to take her home. Patient continues to refuse SNF.   [place] social worker did visit patient this morning and will plan to follow up with patient in the community after discharge. Alerted APS social worker that patient would discharge home.   CSW will sign off.  Estanislado Emms, LCSW [phone]

## 2018-12-08 ENCOUNTER — Ambulatory Visit: Payer: Medicare Other | Admitting: Cardiology

## 2018-12-22 ENCOUNTER — Ambulatory Visit (INDEPENDENT_AMBULATORY_CARE_PROVIDER_SITE_OTHER): Admitting: Cardiology

## 2018-12-22 ENCOUNTER — Encounter: Payer: Self-pay | Admitting: Cardiology

## 2018-12-22 VITALS — BP 138/72 | HR 56 | Ht 62.0 in | Wt 107.0 lb

## 2018-12-22 DIAGNOSIS — I2510 Atherosclerotic heart disease of native coronary artery without angina pectoris: Secondary | ICD-10-CM

## 2018-12-22 DIAGNOSIS — I739 Peripheral vascular disease, unspecified: Secondary | ICD-10-CM

## 2018-12-22 DIAGNOSIS — I469 Cardiac arrest, cause unspecified: Secondary | ICD-10-CM | POA: Diagnosis not present

## 2018-12-22 DIAGNOSIS — I472 Ventricular tachycardia: Secondary | ICD-10-CM | POA: Diagnosis not present

## 2018-12-22 DIAGNOSIS — I4891 Unspecified atrial fibrillation: Secondary | ICD-10-CM | POA: Diagnosis not present

## 2018-12-22 DIAGNOSIS — I4901 Ventricular fibrillation: Secondary | ICD-10-CM

## 2018-12-22 DIAGNOSIS — E782 Mixed hyperlipidemia: Secondary | ICD-10-CM

## 2018-12-22 DIAGNOSIS — I10 Essential (primary) hypertension: Secondary | ICD-10-CM

## 2018-12-22 DIAGNOSIS — I4721 Torsades de pointes: Secondary | ICD-10-CM

## 2018-12-22 LAB — BASIC METABOLIC PANEL
BUN/Creatinine Ratio: 10 — ABNORMAL LOW (ref 12–28)
BUN: 15 mg/dL (ref 8–27)
CO2: 26 mmol/L (ref 20–29)
Calcium: 9.8 mg/dL (ref 8.7–10.3)
Chloride: 98 mmol/L (ref 96–106)
Creatinine, Ser: 1.54 mg/dL — ABNORMAL HIGH (ref 0.57–1.00)
GFR calc Af Amer: 36 mL/min/{1.73_m2} — ABNORMAL LOW (ref >59)
GFR calc non Af Amer: 32 mL/min/{1.73_m2} — ABNORMAL LOW (ref >59)
Glucose: 78 mg/dL (ref 65–99)
POTASSIUM: 5.3 mmol/L — AB (ref 3.5–5.2)
Sodium: 138 mmol/L (ref 134–144)

## 2018-12-22 LAB — HEPATIC FUNCTION PANEL
ALT: 14 IU/L (ref 0–32)
AST: 15 IU/L (ref 0–40)
Albumin: 4.1 g/dL (ref 3.7–4.7)
Alkaline Phosphatase: 79 IU/L (ref 39–117)
Bilirubin Total: 0.3 mg/dL (ref 0.0–1.2)
Bilirubin, Direct: 0.07 mg/dL (ref 0.00–0.40)
Total Protein: 7.4 g/dL (ref 6.0–8.5)

## 2018-12-22 LAB — MAGNESIUM: Magnesium: 2.2 mg/dL (ref 1.6–2.3)

## 2018-12-22 NOTE — Progress Notes (Signed)
Cardiology Office Note:    Date:  12/22/2018   ID:  Joy Dominguez, DOB April 29, 1941, MRN [id number]  PCP:  Welford Roche, NP  Cardiologist:  Jenean Lindau, MD   Referring MD: Welford Roche, NP    ASSESSMENT:    1. Torsades de pointes ([place])   2. Cardiac arrest with ventricular fibrillation ([place])   3. Atrial fibrillation, unspecified type ([place])   4. Benign essential HTN   5. Mixed hyperlipidemia   6. Coronary artery disease involving native coronary artery of native heart without angina pectoris   7. PAD (peripheral artery disease) ([facility])    PLAN:    In order of problems listed above:  1. Secondary prevention stressed with the patient.  Importance of compliance with diet and medication stressed and she.  Coronary anatomy was discussed with the patient at length and secondary prevention stressed.  She is agreeable.  She will have blood work today including Chem-7 and magnesium level. 2. Patient will be seen in follow-up appointment in 6 months or earlier if the patient has any concerns 3. She was advised to get established and go back to her primary care doctor.  She is taking amiodarone 200 daily in view of her ventricular arrhythmias.   Medication Adjustments/Labs and Tests Ordered: Current medicines are reviewed at length with the patient today.  Concerns regarding medicines are outlined above.  Orders Placed This Encounter  Procedures  . Basic metabolic panel  . Magnesium  . Hepatic function panel   No orders of the defined types were placed in this encounter.    No chief complaint on file.    History of Present Illness:    Joy Dominguez is a 78 y.o. female.  I reviewed her records from [facility].  She was admitted with an infection.  Subsequently she developed torsade the points and underwent resuscitation for cardiac arrest.  Patient was transferred to [facility] where she was treated.  She underwent coronary angiography and this  revealed nonobstructive disease since then she is doing fine.  No chest pain orthopnea or PND.  She is brought in today in a wheelchair.  At the time of my evaluation, the patient is alert awake oriented and in no distress.  Past Medical History:  Diagnosis Date  . Acute lower GI bleeding   . Anemia   . Anxiety   . Arthritis    "arms" (11/21/2018)  . 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 Cardiac arrest with ventricular fibrillation ([place]) 11/19/2018   She required CPR, got Mg 2 gm, epi x1, and defib x 1, ROSC/notes 11/20/2018  . CHF (congestive heart failure) ([place])   . Chronic bronchitis ([place])   . CKD (chronic kidney disease), stage III ([place])   . COPD (chronic obstructive pulmonary disease) ([place])   . CVA (cerebral vascular accident) ([place]) 2015  . GERD (gastroesophageal reflux disease)   . History of blood transfusion    "I had a LGIB" (11/21/2018)  . HTN (hypertension)   . Hyperlipidemia   . OA (osteoarthritis)   . On home oxygen therapy    "2L at nighttime" (11/21/2018)  . PAF (paroxysmal atrial fibrillation) ([place])   . Pneumonia    "twice" (11/21/2018)  . Seasonal allergies     Past Surgical History:  Procedure Laterality Date  . CARDIAC CATHETERIZATION     > 10 yr ago, no PCI  . RIGHT/LEFT HEART CATH AND CORONARY ANGIOGRAPHY N/A 11/24/2018   Procedure:  RIGHT/LEFT HEART CATH AND CORONARY ANGIOGRAPHY;  Surgeon: Nelva Bush, MD;  Location: [facility];  Service: Cardiovascular;  Laterality: N/A;  . 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 albuterol (PROVENTIL) (2.5 MG/3ML) 0.083% nebulizer solution Take 2.5 mg by nebulization every 6 (six) hours as needed for wheezing or shortness of breath.  Marland Kitchen amiodarone (PACERONE) 200 MG tablet 400 mg BID x 5 days, the 400 mg daily x 1 wk and then 200 mg daily (Patient taking differently: Take 200 mg by mouth daily. 400 mg BID x 5 days, the 400 mg daily x 1  wk and then 200 mg daily)  . amLODipine (NORVASC) 10 MG tablet Take 1 tablet (10 mg total) by mouth daily.  Marland Kitchen aspirin 81 MG EC tablet Take 1 tablet (81 mg total) by mouth daily.  Marland Kitchen atorvastatin (LIPITOR) 80 MG tablet Take 1 tablet (80 mg total) by mouth daily.  Marland Kitchen docusate sodium (COLACE) 100 MG capsule Take 100 mg by mouth 2 (two) times daily.  . feeding supplement, ENSURE ENLIVE, (ENSURE ENLIVE) LIQD Take 237 mLs by mouth 2 (two) times daily between meals.  . furosemide (LASIX) 40 MG tablet Take only if you have a 3 lb weight gain overnight or a 5 lb weight gain in 1 wk- remember to take potassium with it  . gabapentin (NEURONTIN) 100 MG capsule Take 100 mg by mouth at bedtime.  Marland Kitchen guaiFENesin (MUCINEX) 600 MG 12 hr tablet Take by mouth 2 (two) times daily.  Marland Kitchen lamoTRIgine (LAMICTAL) 25 MG tablet Take 25 mg by mouth 3 (three) times daily.  Marland Kitchen LORazepam (ATIVAN) 0.5 MG tablet Take 0.5 mg by mouth every 8 (eight) hours.  Marland Kitchen losartan (COZAAR) 50 MG tablet Take 1 tablet (50 mg total) by mouth daily.  . metoprolol succinate (TOPROL-XL) 25 MG 24 hr tablet Take 1 tablet (25 mg total) by mouth daily.  . mometasone (NASONEX) 50 MCG/ACT nasal spray Place 2 sprays into the nose daily.  . Multiple Vitamin (MULTIVITAMIN WITH MINERALS) TABS tablet Take 1 tablet by mouth daily.  . Multiple Vitamins-Minerals (CERTA PLUS) TABS Take 1 tablet by mouth daily.  . potassium chloride (K-DUR,KLOR-CON) 10 MEQ tablet Take only when taking Furosemide.  . [DISCONTINUED] diclofenac sodium (VOLTAREN) 1 % GEL Apply 2 g topically 4 (four) times daily. Apply to sore, tender area on mid back     Allergies:   Penicillins   Social History   Socioeconomic History  . Marital status: Widowed    Spouse name: Not on file  . Number of children: Not on file  . Years of education: Not on file  . Highest education level: Not on file  Occupational History  . Occupation: Retired  Scientific laboratory technician  . Financial resource strain: Not on  file  . Food insecurity:    Worry: Not on file    Inability: Not on file  . Transportation needs:    Medical: Not on file    Non-medical: Not on file  Tobacco Use  . Smoking status: Former Smoker    Types: Cigarettes  . Smokeless tobacco: Current User    Types: Snuff  . Tobacco comment: 11/21/2018 "stopped a long time ago"  Substance and Sexual Activity  . Alcohol use: Not Currently  . Drug use: Never  . Sexual activity: Not Currently  Lifestyle  . Physical activity:    Days per  week: Not on file    Minutes per session: Not on file  . Stress: Not on file  Relationships  . Social connections:    Talks on phone: Not on file    Gets together: Not on file    Attends religious service: Not on file    Active member of club or organization: Not on file    Attends meetings of clubs or organizations: Not on file    Relationship status: Not on file  Other Topics Concern  . Not on file  Social History Narrative   Pt lives in [place], she says w/ 2 sons, says U[place]do Rising helps w/ her medicines.     Family History: The patient's family history includes Cancer - Colon in her brother; Diabetes in her brother; Heart disease in her father; Hypertension in her mother.  ROS:   Please see the history of present illness.    All other systems reviewed and are negative.  EKGs/Labs/Other Studies Reviewed:    The following studies were reviewed today: I reviewed [facility] records extensively.   Recent Labs: 11/24/2018: Magnesium 1.5 11/27/2018: BUN 25; Creatinine, Ser 1.38; Hemoglobin 9.7; Platelets 504; Potassium 4.8; Sodium 137  Recent Lipid Panel    Component Value Date/Time   CHOL 159 11/23/2018 0755   TRIG 70 11/23/2018 0755   HDL 39 (L) 11/23/2018 0755   CHOLHDL 4.1 11/23/2018 0755   VLDL 14 11/23/2018 0755   LDLCALC 106 (H) 11/23/2018 0755    Physical Exam:    VS:  BP 138/72 (BP Location: Right Arm, Patient Position: Sitting, Cuff Size: Normal)   Pulse (!) 56   Ht 5\' 2"   (1.575 m)   Wt 107 lb (48.5 kg)   SpO2 94%   BMI 19.57 kg/m     Wt Readings from Last 3 Encounters:  12/22/18 107 lb (48.5 kg)  11/26/18 106 lb 1.6 oz (48.1 kg)     GEN: Patient is in no acute distress HEENT: Normal NECK: No JVD; No carotid bruits LYMPHATICS: No lymphadenopathy CARDIAC: Hear sounds regular, 2/6 systolic murmur at the apex. RESPIRATORY:  Clear to auscultation without rales, wheezing or rhonchi  ABDOMEN: Soft, non-tender, non-distended MUSCULOSKELETAL:  No edema; No deformity  SKIN: Warm and dry NEUROLOGIC:  Alert and oriented x 3 PSYCHIATRIC:  Normal affect   Signed, Jenean Lindau, MD  12/22/2018 11:26 AM    [place]

## 2018-12-22 NOTE — Patient Instructions (Signed)
Medication Instructions:  Your physician recommends that you continue on your current medications as directed. Please refer to the Current Medication list given to you today.  If you need a refill on your cardiac medications before your next appointment, please call your pharmacy.   Lab work: Your physician recommends that you return for lab work today: bmp, mg, lft If you have labs (blood work) drawn today and your tests are completely normal, you will receive your results only by: Marland Kitchen MyChart Message (if you have MyChart) OR . A paper copy in the mail If you have any lab test that is abnormal or we need to change your treatment, we will call you to review the results.  Testing/Procedures: None.   Follow-Up: At [facility], you and your health needs are our priority.  As part of our continuing mission to provide you with exceptional heart care, we have created designated Provider Care Teams.  These Care Teams include your primary Cardiologist (physician) and Advanced Practice Providers (APPs -  Physician Assistants and Nurse Practitioners) who all work together to provide you with the care you need, when you need it. You will need a follow up appointment in 4 months.  Please call our office 2 months in advance to schedule this appointment.  You may see Ena Dawley, MD or another member of our [place] Provider Team in [place]: Jenne Campus, MD . Shirlee More, MD  Any Other Special Instructions Will Be Listed Below (If Applicable).

## 2018-12-23 ENCOUNTER — Telehealth: Payer: Self-pay | Admitting: Emergency Medicine

## 2018-12-23 DIAGNOSIS — E875 Hyperkalemia: Secondary | ICD-10-CM

## 2018-12-23 NOTE — Telephone Encounter (Signed)
Patient's son informed of lab result , advised to have labs redrawn in 1 week and to stop potassium. He will inform patient. He verbally understands

## 2018-12-29 ENCOUNTER — Telehealth: Payer: Self-pay | Admitting: Emergency Medicine

## 2018-12-29 NOTE — Telephone Encounter (Signed)
Sharyn Lull, RN with Hospice is calling to confirm medication instructions for patient that were given to son. Informed her that patient's son was advised for patient to stop potassium and have labs redrawn this week. She verbally understands and will confirm this is what is being done. During the call she reported that the patient is also in need of  Anxiety medication, she would like Dr. Julien Nordmann recommendation and approval for a anxiety medication. Will consult with Dr. Geraldo Pitter regarding this.

## 2018-12-30 NOTE — Telephone Encounter (Signed)
Please have them address this with primary care doctor

## 2018-12-30 NOTE — Telephone Encounter (Signed)
Sharyn Lull, RN advised to follow up with pcp regarding this. She verbally understands. She reports she is going to draw labs now and will fax them to [place].

## 2019-01-13 NOTE — Telephone Encounter (Signed)
Left message with Sharyn Lull RN for her to send results of lab work since we haven't received yet.

## 2019-02-07 DIAGNOSIS — D72829 Elevated white blood cell count, unspecified: Secondary | ICD-10-CM

## 2019-02-07 DIAGNOSIS — J9601 Acute respiratory failure with hypoxia: Secondary | ICD-10-CM

## 2019-02-07 DIAGNOSIS — J449 Chronic obstructive pulmonary disease, unspecified: Secondary | ICD-10-CM

## 2019-02-07 DIAGNOSIS — I10 Essential (primary) hypertension: Secondary | ICD-10-CM

## 2019-02-07 DIAGNOSIS — I509 Heart failure, unspecified: Secondary | ICD-10-CM | POA: Diagnosis not present

## 2019-02-07 DIAGNOSIS — J441 Chronic obstructive pulmonary disease with (acute) exacerbation: Secondary | ICD-10-CM | POA: Diagnosis not present

## 2019-02-07 DIAGNOSIS — J9621 Acute and chronic respiratory failure with hypoxia: Secondary | ICD-10-CM

## 2019-02-07 DIAGNOSIS — I679 Cerebrovascular disease, unspecified: Secondary | ICD-10-CM

## 2019-02-07 DIAGNOSIS — E785 Hyperlipidemia, unspecified: Secondary | ICD-10-CM

## 2019-02-08 DIAGNOSIS — I509 Heart failure, unspecified: Secondary | ICD-10-CM | POA: Diagnosis not present

## 2019-02-08 DIAGNOSIS — D72829 Elevated white blood cell count, unspecified: Secondary | ICD-10-CM | POA: Diagnosis not present

## 2019-02-08 DIAGNOSIS — J9601 Acute respiratory failure with hypoxia: Secondary | ICD-10-CM | POA: Diagnosis not present

## 2019-02-08 DIAGNOSIS — J441 Chronic obstructive pulmonary disease with (acute) exacerbation: Secondary | ICD-10-CM | POA: Diagnosis not present

## 2019-02-09 DIAGNOSIS — J441 Chronic obstructive pulmonary disease with (acute) exacerbation: Secondary | ICD-10-CM | POA: Diagnosis not present

## 2019-02-09 DIAGNOSIS — D72829 Elevated white blood cell count, unspecified: Secondary | ICD-10-CM | POA: Diagnosis not present

## 2019-02-09 DIAGNOSIS — I509 Heart failure, unspecified: Secondary | ICD-10-CM | POA: Diagnosis not present

## 2019-02-09 DIAGNOSIS — J9601 Acute respiratory failure with hypoxia: Secondary | ICD-10-CM | POA: Diagnosis not present

## 2019-02-10 DIAGNOSIS — I509 Heart failure, unspecified: Secondary | ICD-10-CM | POA: Diagnosis not present

## 2019-02-10 DIAGNOSIS — D72829 Elevated white blood cell count, unspecified: Secondary | ICD-10-CM | POA: Diagnosis not present

## 2019-02-10 DIAGNOSIS — J441 Chronic obstructive pulmonary disease with (acute) exacerbation: Secondary | ICD-10-CM | POA: Diagnosis not present

## 2019-02-10 DIAGNOSIS — J9601 Acute respiratory failure with hypoxia: Secondary | ICD-10-CM | POA: Diagnosis not present

## 2019-02-18 DIAGNOSIS — J449 Chronic obstructive pulmonary disease, unspecified: Secondary | ICD-10-CM

## 2019-02-18 DIAGNOSIS — I4891 Unspecified atrial fibrillation: Secondary | ICD-10-CM | POA: Diagnosis not present

## 2019-02-18 DIAGNOSIS — J189 Pneumonia, unspecified organism: Secondary | ICD-10-CM

## 2019-02-18 DIAGNOSIS — I10 Essential (primary) hypertension: Secondary | ICD-10-CM

## 2019-02-18 DIAGNOSIS — I509 Heart failure, unspecified: Secondary | ICD-10-CM | POA: Diagnosis not present

## 2019-02-18 DIAGNOSIS — J9601 Acute respiratory failure with hypoxia: Secondary | ICD-10-CM | POA: Diagnosis not present

## 2019-02-19 DIAGNOSIS — I4891 Unspecified atrial fibrillation: Secondary | ICD-10-CM | POA: Diagnosis not present

## 2019-02-19 DIAGNOSIS — J9601 Acute respiratory failure with hypoxia: Secondary | ICD-10-CM | POA: Diagnosis not present

## 2019-02-19 DIAGNOSIS — J189 Pneumonia, unspecified organism: Secondary | ICD-10-CM | POA: Diagnosis not present

## 2019-02-19 DIAGNOSIS — I509 Heart failure, unspecified: Secondary | ICD-10-CM | POA: Diagnosis not present

## 2019-02-20 DIAGNOSIS — I4891 Unspecified atrial fibrillation: Secondary | ICD-10-CM | POA: Diagnosis not present

## 2019-02-20 DIAGNOSIS — J189 Pneumonia, unspecified organism: Secondary | ICD-10-CM | POA: Diagnosis not present

## 2019-02-20 DIAGNOSIS — J9601 Acute respiratory failure with hypoxia: Secondary | ICD-10-CM | POA: Diagnosis not present

## 2019-02-20 DIAGNOSIS — I509 Heart failure, unspecified: Secondary | ICD-10-CM | POA: Diagnosis not present

## 2019-02-21 DIAGNOSIS — J189 Pneumonia, unspecified organism: Secondary | ICD-10-CM | POA: Diagnosis not present

## 2019-02-21 DIAGNOSIS — I509 Heart failure, unspecified: Secondary | ICD-10-CM | POA: Diagnosis not present

## 2019-02-21 DIAGNOSIS — J9601 Acute respiratory failure with hypoxia: Secondary | ICD-10-CM | POA: Diagnosis not present

## 2019-02-21 DIAGNOSIS — I4891 Unspecified atrial fibrillation: Secondary | ICD-10-CM | POA: Diagnosis not present

## 2019-03-11 ENCOUNTER — Telehealth: Payer: Self-pay

## 2019-03-11 NOTE — Telephone Encounter (Signed)
RN called patient and received phone consent for telemedicine visit. Patient does not have a home BP cuff available but she will weigh herself and all medication out for reconciliation/refills. Patient scheduled for virtual visit and had no further questions.

## 2019-03-16 ENCOUNTER — Telehealth: Payer: Self-pay

## 2019-03-16 ENCOUNTER — Telehealth: Payer: Medicare Other | Admitting: Cardiology

## 2019-03-16 ENCOUNTER — Telehealth: Payer: Self-pay | Admitting: Cardiology

## 2019-03-16 ENCOUNTER — Telehealth (INDEPENDENT_AMBULATORY_CARE_PROVIDER_SITE_OTHER): Admitting: Cardiology

## 2019-03-16 ENCOUNTER — Other Ambulatory Visit: Payer: Self-pay

## 2019-03-16 ENCOUNTER — Encounter: Payer: Self-pay | Admitting: Cardiology

## 2019-03-16 VITALS — BP 114/81 | HR 79 | Ht 62.0 in | Wt 104.1 lb

## 2019-03-16 DIAGNOSIS — I4901 Ventricular fibrillation: Secondary | ICD-10-CM

## 2019-03-16 DIAGNOSIS — I10 Essential (primary) hypertension: Secondary | ICD-10-CM

## 2019-03-16 DIAGNOSIS — I472 Ventricular tachycardia: Secondary | ICD-10-CM | POA: Diagnosis not present

## 2019-03-16 DIAGNOSIS — I4721 Torsades de pointes: Secondary | ICD-10-CM

## 2019-03-16 DIAGNOSIS — I2510 Atherosclerotic heart disease of native coronary artery without angina pectoris: Secondary | ICD-10-CM

## 2019-03-16 DIAGNOSIS — I469 Cardiac arrest, cause unspecified: Secondary | ICD-10-CM

## 2019-03-16 NOTE — Telephone Encounter (Signed)
Virtual Visit Pre-Appointment Phone Call  Steps For Call:  1. Confirm consent - "In the setting of the current Covid19 crisis, you are scheduled for a (phone or video) visit with your provider on (date) at (time).  Just as we do with many in-office visits, in order for you to participate in this visit, we must obtain consent.  If you'd like, I can send this to your mychart (if signed up) or email for you to review.  Otherwise, I can obtain your verbal consent now.  All virtual visits are billed to your insurance company just like a normal visit would be.  By agreeing to a virtual visit, we'd like you to understand that the technology does not allow for your provider to perform an examination, and thus may limit your provider's ability to fully assess your condition. If your provider identifies any concerns that need to be evaluated in person, we will make arrangements to do so.  Finally, though the technology is pretty good, we cannot assure that it will always work on either your or our end, and in the setting of a video visit, we may have to convert it to a phone-only visit.  In either situation, we cannot ensure that we have a secure connection.  Are you willing to proceed?" STAFF: Did the patient verbally acknowledge consent to telehealth visit? Document YES/NO here: YES  2. Confirm the BEST phone number to call the day of the visit by including in appointment notes  3. Give patient instructions for WebEx/MyChart download to smartphone as below or Doximity/Doxy.me if video visit (depending on what platform provider is using)  4. Advise patient to be prepared with their blood pressure, heart rate, weight, any heart rhythm information, their current medicines, and a piece of paper and pen handy for any instructions they may receive the day of their visit  5. Inform patient they will receive a phone call 15 minutes prior to their appointment time (may be from unknown caller ID) so they should be  prepared to answer  6. Confirm that appointment type is correct in Epic appointment notes (VIDEO vs PHONE)     TELEPHONE CALL NOTE  Joy Dominguez has been deemed a candidate for a follow-up tele-health visit to limit community exposure during the Covid-19 pandemic. I spoke with the patient via phone to ensure availability of phone/video source, confirm preferred email & phone number, and discuss instructions and expectations.  I reminded Joy Dominguez to be prepared with any vital sign and/or heart rhythm information that could potentially be obtained via home monitoring, at the time of her visit. I reminded Joy Dominguez to expect a phone call at the time of her visit if her visit.  Frederic Jericho 03/16/2019 9:58 AM   INSTRUCTIONS FOR DOWNLOADING THE WEBEX APP TO SMARTPHONE  - If Apple, ask patient to go to App Store and type in WebEx in the search bar. [employer], the blue/green circle. If Android, go to [employer] and type in [employer] in the search bar. The app is free but as with any other app downloads, their phone may require them to verify saved payment information or Apple/Android password.  - The patient does NOT have to create an account. - On the day of the visit, the assist will walk the patient through joining the meeting with the meeting number/password.  INSTRUCTIONS FOR DOWNLOADING THE MYCHART APP TO SMARTPHONE  - The patient  must first make sure to have activated MyChart and know their login information - If Apple, go to [employer] and type in MyChart in the search bar and download the app. If Android, ask patient to go to [employer] and type in [place] in the search bar and download the app. The app is free but as with any other app downloads, their phone may require them to verify saved payment information or Apple/Android password.  - The patient will need to then log into the app with their MyChart username and  password, and select [facility] as their healthcare provider to link the account. When it is time for your visit, go to the MyChart app, find appointments, and click Begin Video Visit. Be sure to Select Allow for your device to access the Microphone and Camera for your visit. You will then be connected, and your provider will be with you shortly.  **If they have any issues connecting, or need assistance please contact MyChart service desk [phone])**  **If using a computer, in order to ensure the best quality for their visit they will need to use either of the following Internet Browsers: [employer], or Google Chrome**  IF USING DOXIMITY or DOXY.ME - The patient will receive a link just prior to their visit, either by text or email (to be determined day of appointment depending on if it's doxy.me or Doximity).     FULL LENGTH CONSENT FOR TELE-HEALTH VISIT   I hereby voluntarily request, consent and authorize [facility] and its employed or contracted physicians, physician assistants, nurse practitioners or other licensed health care professionals (the Practitioner), to provide me with telemedicine health care services (the Services") as deemed necessary by the treating Practitioner. I acknowledge and consent to receive the Services by the Practitioner via telemedicine. I understand that the telemedicine visit will involve communicating with the Practitioner through live audiovisual communication technology and the disclosure of certain medical information by electronic transmission. I acknowledge that I have been given the opportunity to request an in-person assessment or other available alternative prior to the telemedicine visit and am voluntarily participating in the telemedicine visit.  I understand that I have the right to withhold or withdraw my consent to the use of telemedicine in the course of my care at any time, without affecting my right to future care or  treatment, and that the Practitioner or I may terminate the telemedicine visit at any time. I understand that I have the right to inspect all information obtained and/or recorded in the course of the telemedicine visit and may receive copies of available information for a reasonable fee.  I understand that some of the potential risks of receiving the Services via telemedicine include:   Delay or interruption in medical evaluation due to technological equipment failure or disruption;  Information transmitted may not be sufficient (e.g. poor resolution of images) to allow for appropriate medical decision making by the Practitioner; and/or   In rare instances, security protocols could fail, causing a breach of personal health information.  Furthermore, I acknowledge that it is my responsibility to provide information about my medical history, conditions and care that is complete and accurate to the best of my ability. I acknowledge that Practitioner's advice, recommendations, and/or decision may be based on factors not within their control, such as incomplete or inaccurate data provided by me or distortions of diagnostic images or specimens that may result from electronic transmissions. I understand that the practice of medicine is  not an Chief Strategy Officer and that Practitioner makes no warranties or guarantees regarding treatment outcomes. I acknowledge that I will receive a copy of this consent concurrently upon execution via email to the email address I last provided but may also request a printed copy by calling the office of [facility].    I understand that my insurance will be billed for this visit.   I have read or had this consent read to me.  I understand the contents of this consent, which adequately explains the benefits and risks of the Services being provided via telemedicine.   I have been provided ample opportunity to ask questions regarding this consent and the Services and have had my  questions answered to my satisfaction.  I give my informed consent for the services to be provided through the use of telemedicine in my medical care  By participating in this telemedicine visit I agree to the above.

## 2019-03-16 NOTE — Progress Notes (Signed)
Virtual Visit via Telephone Note   This visit type was conducted due to national recommendations for restrictions regarding the COVID-19 Pandemic (e.g. social distancing) in an effort to limit this patient's exposure and mitigate transmission in our community.  Due to her co-morbid illnesses, this patient is at least at moderate risk for complications without adequate follow up.  This format is felt to be most appropriate for this patient at this time.  The patient did not have access to video technology/had technical difficulties with video requiring transitioning to audio format only (telephone).  All issues noted in this document were discussed and addressed.  No physical exam could be performed with this format.  Please refer to the patient's chart for her  consent to telehealth for [facility].   Evaluation Performed:  Follow-up visit  Date:  03/16/2019   ID:  Joy Dominguez, DOB 1940-12-01, MRN [id number]  Patient Location: Home Provider Location: Home  PCP:  Welford Roche, NP  Cardiologist:  Ena Dawley, MD  Electrophysiologist:  None   Chief Complaint: History of cardiac arrest  History of Present Illness:    Joy Dominguez is a 78 y.o. female with history of torsade and cardiac arrest.  Subsequently coronary angiography revealed only nonobstructive disease.  Is a poor historian.  She currently denies any problems.  Her son is on the phone and he mentions to me that she is under hospice care.  She is an overall poor health.  At the time of my evaluation, the patient is alert awake oriented and in no distress.  The patient does not have symptoms concerning for COVID-19 infection (fever, chills, cough, or new shortness of breath).    Past Medical History:  Diagnosis Date  . Acute lower GI bleeding   . Anemia   . Anxiety   . Arthritis    "arms" (11/21/2018)  . 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 Cardiac arrest with ventricular fibrillation ([place]) 11/19/2018    She required CPR, got Mg 2 gm, epi x1, and defib x 1, ROSC/notes 11/20/2018  . CHF (congestive heart failure) ([place])   . Chronic bronchitis ([place])   . CKD (chronic kidney disease), stage III ([place])   . COPD (chronic obstructive pulmonary disease) ([place])   . CVA (cerebral vascular accident) ([place]) 2015  . GERD (gastroesophageal reflux disease)   . History of blood transfusion    "I had a LGIB" (11/21/2018)  . HTN (hypertension)   . Hyperlipidemia   . OA (osteoarthritis)   . On home oxygen therapy    "2L at nighttime" (11/21/2018)  . PAF (paroxysmal atrial fibrillation) ([place])   . Pneumonia    "twice" (11/21/2018)  . Seasonal allergies    Past Surgical History:  Procedure Laterality Date  . CARDIAC CATHETERIZATION     > 10 yr ago, no PCI  . RIGHT/LEFT HEART CATH AND CORONARY ANGIOGRAPHY N/A 11/24/2018   Procedure: RIGHT/LEFT HEART CATH AND CORONARY ANGIOGRAPHY;  Surgeon: Nelva Bush, MD;  Location: [facility];  Service: Cardiovascular;  Laterality: N/A;  . 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 albuterol (PROVENTIL) (2.5 MG/3ML) 0.083% nebulizer solution Take 2.5 mg by nebulization every 6 (six) hours as needed for wheezing or shortness of breath.  Marland Kitchen amiodarone (PACERONE) 200 MG tablet 400 mg BID x 5 days, the 400 mg daily x 1 wk and  then 200 mg daily (Patient taking differently: Take 200 mg by mouth daily. 400 mg BID x 5 days, the 400 mg daily x 1 wk and then 200 mg daily)  . atorvastatin (LIPITOR) 80 MG tablet Take 1 tablet (80 mg total) by mouth daily.     Allergies:   Penicillins   Social History   Tobacco Use  . Smoking status: Former Smoker    Types: Cigarettes  . Smokeless tobacco: Current User    Types: Snuff  . Tobacco comment: 11/21/2018 "stopped a long time ago"  Substance Use Topics  . Alcohol use: Not Currently  . Drug use: Never     Family Hx: The patient's family history includes Cancer  - Colon in her brother; Diabetes in her brother; Heart disease in her father; Hypertension in her mother.  ROS:   Please see the history of present illness.    Patient denies any other symptoms All other systems reviewed and are negative.   Prior CV studies:   The following studies were reviewed today:  Coronary angiography report was reviewed again  Labs/Other Tests and Data Reviewed:    EKG:  No ECG reviewed.  Recent Labs: 11/27/2018: Hemoglobin 9.7; Platelets 504 12/22/2018: ALT 14; BUN 15; Creatinine, Ser 1.54; Magnesium 2.2; Potassium 5.3; Sodium 138   Recent Lipid Panel Lab Results  Component Value Date/Time   CHOL 159 11/23/2018 07:55 AM   TRIG 70 11/23/2018 07:55 AM   HDL 39 (L) 11/23/2018 07:55 AM   CHOLHDL 4.1 11/23/2018 07:55 AM   LDLCALC 106 (H) 11/23/2018 07:55 AM    Wt Readings from Last 3 Encounters:  03/16/19 104 lb 1.6 oz (47.2 kg)  12/22/18 107 lb (48.5 kg)  11/26/18 106 lb 1.6 oz (48.1 kg)     Objective:    Vital Signs:  BP 114/81 (BP Location: Left Arm, Patient Position: Sitting, Cuff Size: Normal)   Pulse 79   Ht 5\' 2"  (1.575 m)   Wt 104 lb 1.6 oz (47.2 kg)   BMI 19.04 kg/m    VITAL SIGNS:  reviewed  ASSESSMENT & PLAN:    1. Nonobstructive coronary artery disease: Patient was reassured about these findings.  Her son mentions to me that she is in very frail state and under hospice care. 2. Patient mentions to me that at the place where she received injections in the hospital she has some pain like sensation.  This was several months ago.  She was advised to get in touch with her primary care physician immediately to get this assessed and she agreed. 3. Her medications were also reviewed and she will continue it at this time and see me in follow-up appointment in 2 months or earlier if she has any concerns.  She knows to call us if there are any questions and knows to go to the nearest emergency room if there are any significant problems.  Patient  and son had multiple questions which were answered to her satisfaction.  COVID-19 Education: The signs and symptoms of COVID-19 were discussed with the patient and how to seek care for testing (follow up with PCP or arrange E-visit).  The importance of social distancing was discussed today.  Time:   Today, I have spent 12 minutes with the patient with telehealth technology discussing the above problems.     Medication Adjustments/Labs and Tests Ordered: Current medicines are reviewed at length with the patient today.  Concerns regarding medicines are outlined above.   Tests Ordered: No orders  of the defined types were placed in this encounter.   Medication Changes: No orders of the defined types were placed in this encounter.   Disposition:  Follow up in 2 month(s)  Signed, Jenean Lindau, MD  03/16/2019 3:45 PM    [place]

## 2019-03-16 NOTE — Telephone Encounter (Signed)
Called patient and started visit and did the rooming process and dr rrr has tried to call patient and has been sent to voicemail and after a few times I tried and been sent straight to voicemail also

## 2019-09-27 DEATH — deceased

## 2020-07-06 IMAGING — CR DG CHEST 2V
2 series · 2 of 2 positions shown · non-contrast
Comparison: Chest x-ray of 11/23/2018, 11/21/2018 and 11/20/2017

CLINICAL DATA: Acute respiratory failure, edema, history of chronic
bronchitis and CHF

EXAM:
CHEST - 2 VIEW

[chest lat]
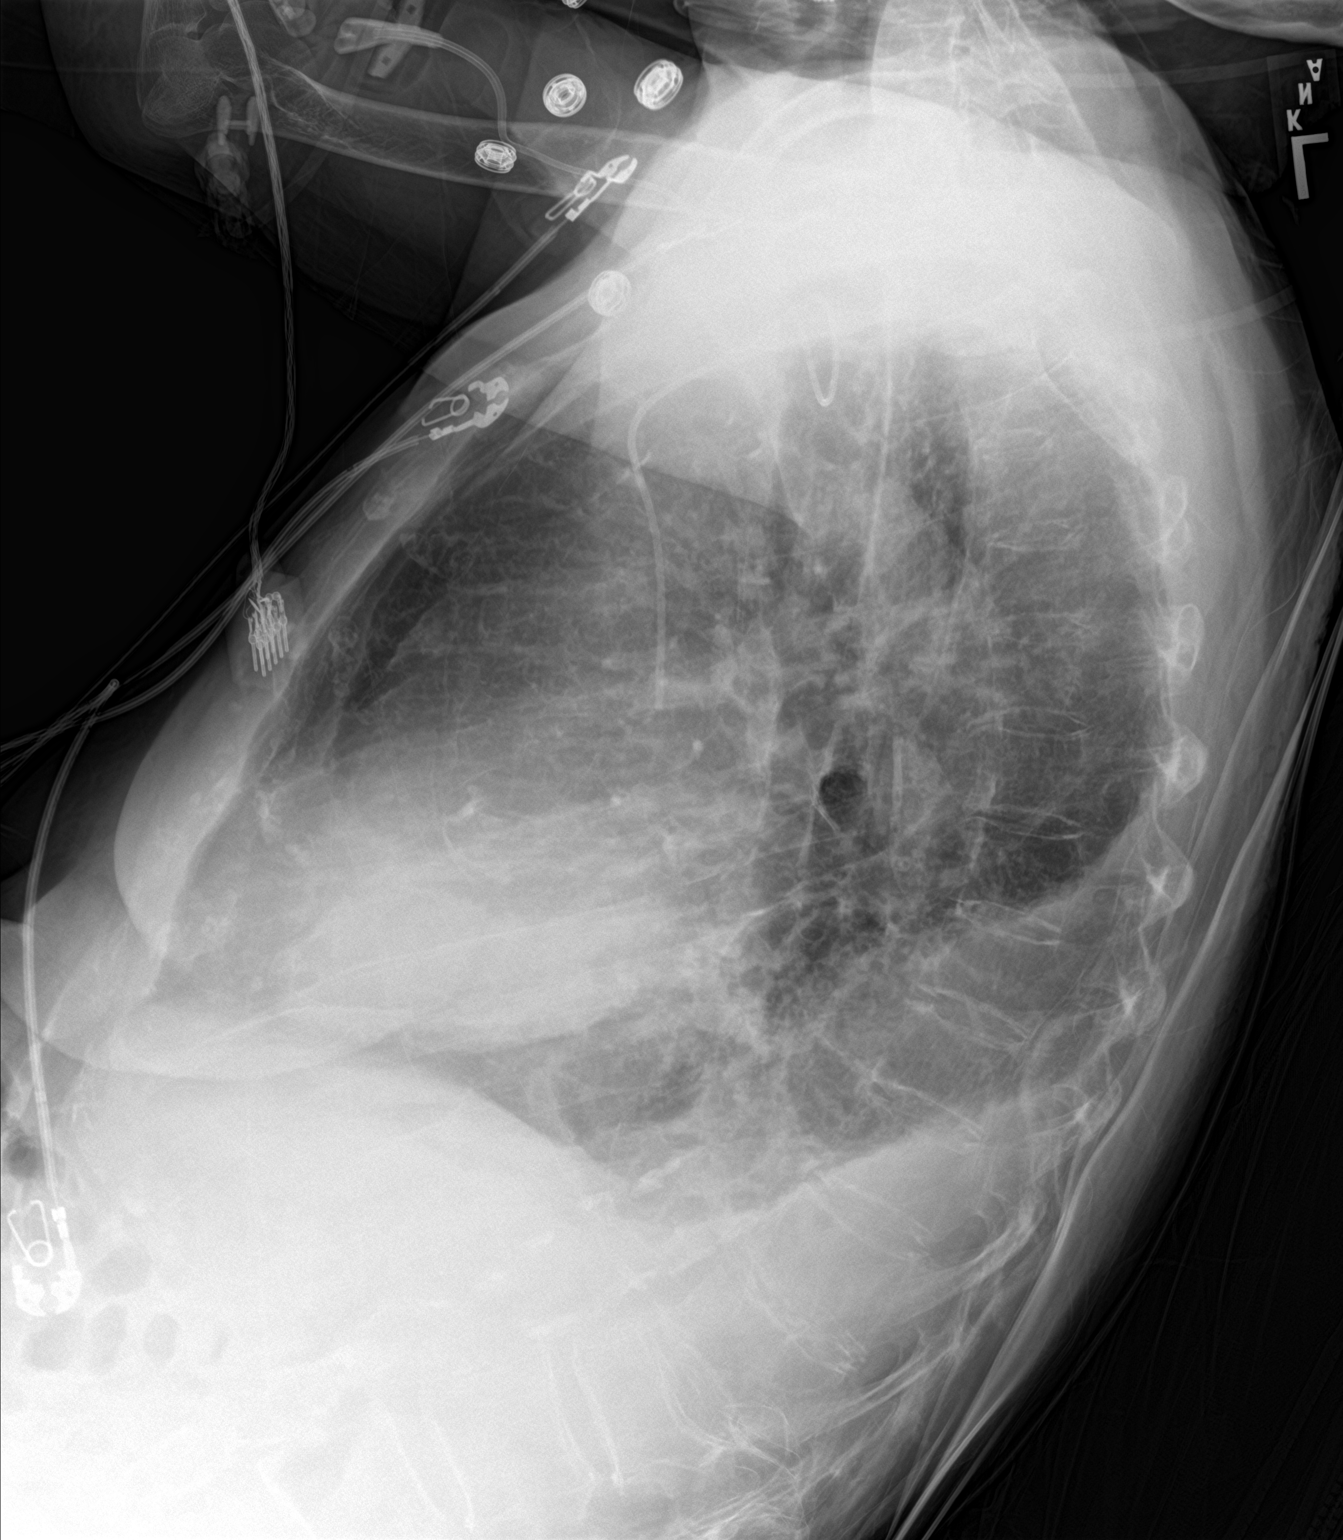

[chest ap]
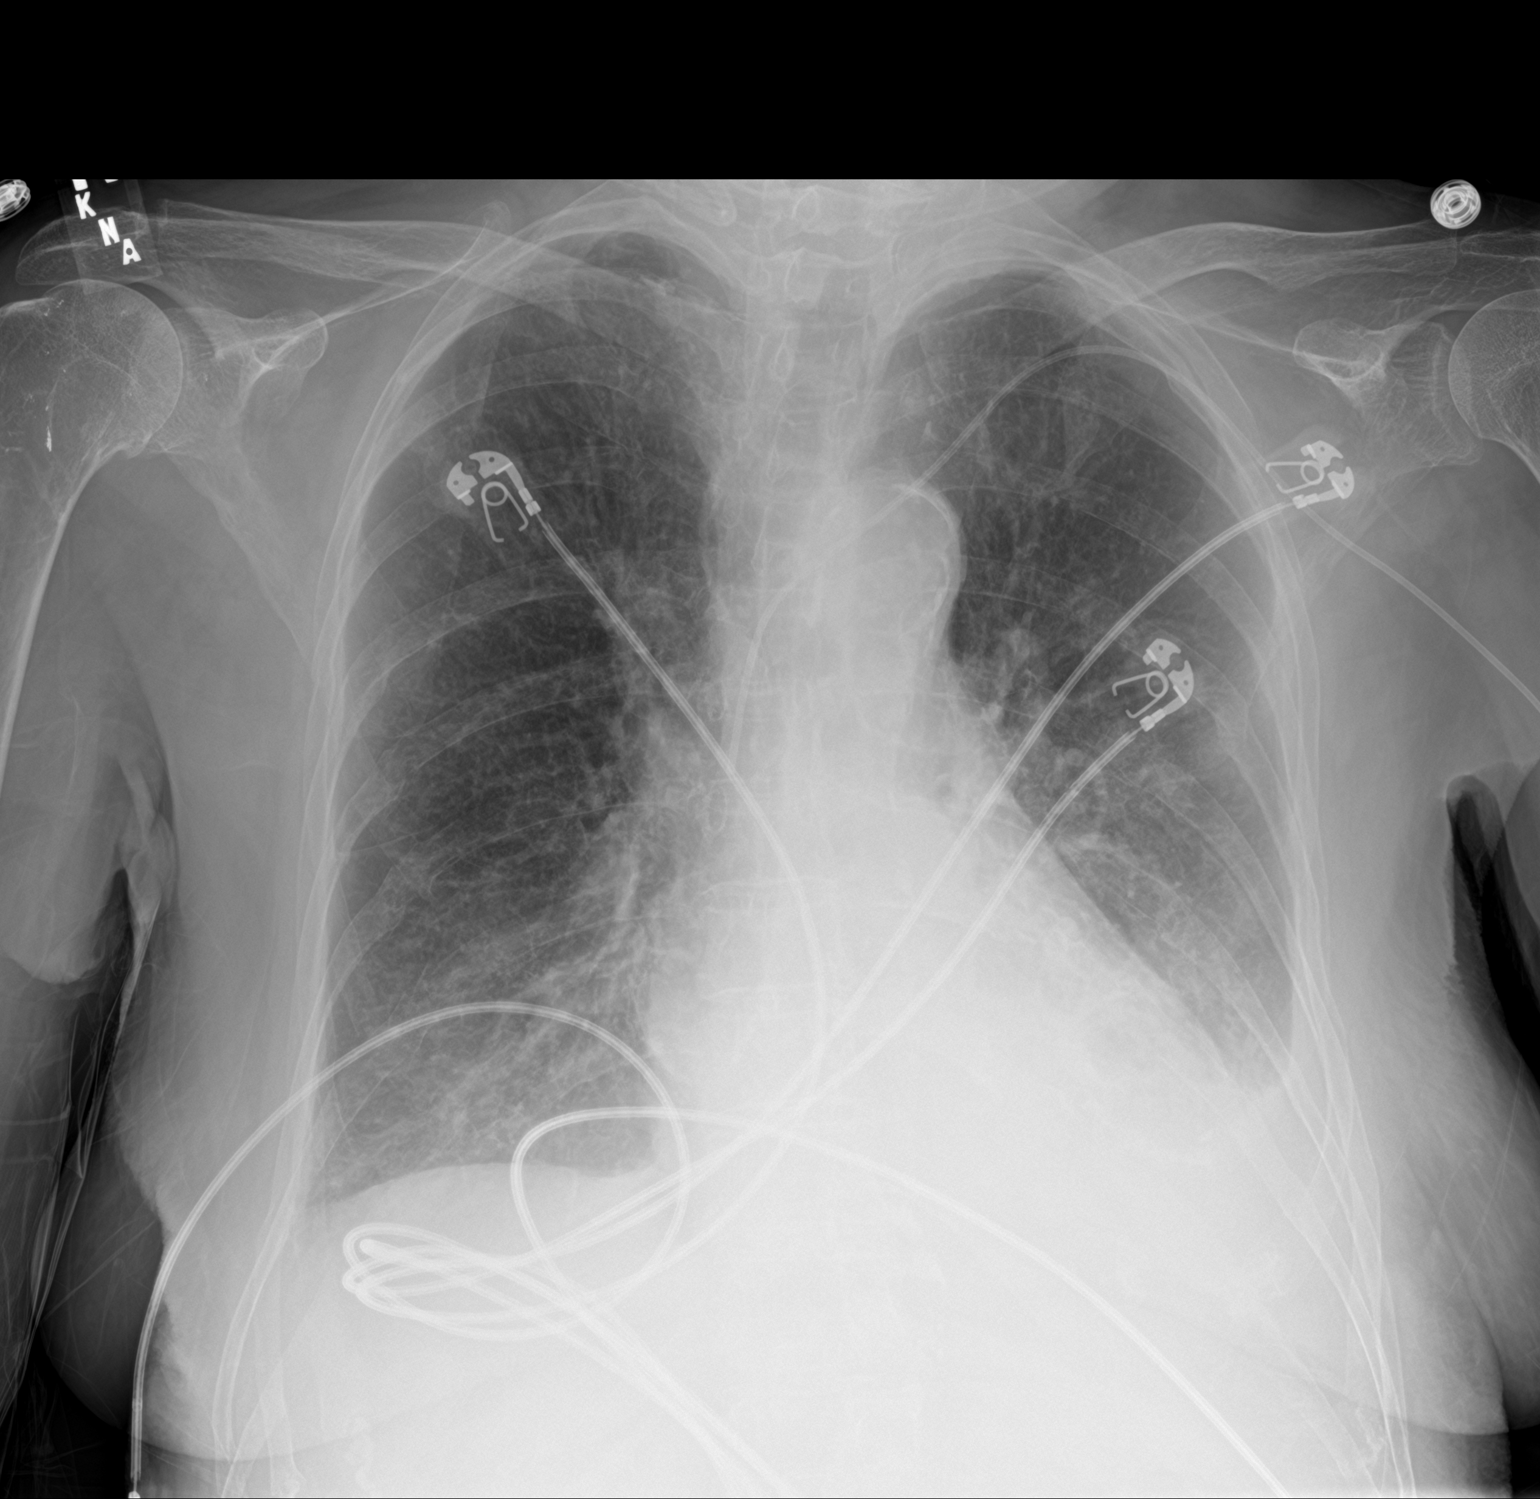

[2 of 2 positions shown; findings below may reference images not displayed]

There is little change in left basilar opacity some of which is due
to atelectasis. There are small to moderate-sized bilateral pleural
effusions present left greater than right resulting in some basilar
atelectasis. There is a mild degree of pulmonary vascular congestion
which has improved in the interval. Mild cardiomegaly is stable.
Left PICC line tip overlies the mid SVC. No acute bony abnormality
is seen.
IMPRESSION: 1. Persistent pleural effusions left-greater-than-right with
resulting basilar atelectasis.
2. Mild cardiomegaly and mild pulmonary vascular congestion with
some improvement in the degree of pulmonary vascular congestion.

## 2020-09-26 DEATH — deceased

## 2021-09-26 DEATH — deceased
